# Patient Record
Sex: Male | Born: 1975 | Race: Black or African American | Hispanic: No | Marital: Single | Smoking: Current every day smoker
Health system: Southern US, Community
[De-identification: ages and names within clinical notes are randomized; demographics above are authoritative.]

---

## 2006-08-06 ENCOUNTER — Emergency Department (HOSPITAL_COMMUNITY): Admission: EM | Admit: 2006-08-06 | Discharge: 2006-08-07 | Payer: Self-pay | Admitting: Emergency Medicine

## 2006-08-19 ENCOUNTER — Ambulatory Visit: Payer: Self-pay | Admitting: Nurse Practitioner

## 2006-08-24 ENCOUNTER — Ambulatory Visit: Payer: Self-pay | Admitting: *Deleted

## 2007-07-14 ENCOUNTER — Encounter (INDEPENDENT_AMBULATORY_CARE_PROVIDER_SITE_OTHER): Payer: Self-pay | Admitting: *Deleted

## 2009-05-24 ENCOUNTER — Emergency Department (HOSPITAL_COMMUNITY): Admission: EM | Admit: 2009-05-24 | Discharge: 2009-05-24 | Payer: Self-pay | Admitting: Emergency Medicine

## 2014-10-20 ENCOUNTER — Emergency Department (HOSPITAL_COMMUNITY)
Admission: EM | Admit: 2014-10-20 | Discharge: 2014-10-20 | Disposition: A | Discharge status: Home / Self Care | Payer: Self-pay | Attending: Emergency Medicine | Admitting: Emergency Medicine

## 2014-10-20 ENCOUNTER — Emergency Department (HOSPITAL_COMMUNITY): Payer: Self-pay

## 2014-10-20 ENCOUNTER — Encounter (HOSPITAL_COMMUNITY): Payer: Self-pay | Admitting: Nurse Practitioner

## 2014-10-20 DIAGNOSIS — Z720 Tobacco use: Secondary | ICD-10-CM | POA: Insufficient documentation

## 2014-10-20 DIAGNOSIS — S0083XA Contusion of other part of head, initial encounter: Secondary | ICD-10-CM

## 2014-10-20 DIAGNOSIS — S01412A Laceration without foreign body of left cheek and temporomandibular area, initial encounter: Secondary | ICD-10-CM | POA: Insufficient documentation

## 2014-10-20 DIAGNOSIS — Y998 Other external cause status: Secondary | ICD-10-CM | POA: Insufficient documentation

## 2014-10-20 DIAGNOSIS — H1131 Conjunctival hemorrhage, right eye: Secondary | ICD-10-CM | POA: Insufficient documentation

## 2014-10-20 DIAGNOSIS — Y9389 Activity, other specified: Secondary | ICD-10-CM | POA: Insufficient documentation

## 2014-10-20 DIAGNOSIS — S0011XA Contusion of right eyelid and periocular area, initial encounter: Secondary | ICD-10-CM

## 2014-10-20 DIAGNOSIS — Y9289 Other specified places as the place of occurrence of the external cause: Secondary | ICD-10-CM | POA: Insufficient documentation

## 2014-10-20 DIAGNOSIS — S02402A Zygomatic fracture, unspecified, initial encounter for closed fracture: Secondary | ICD-10-CM | POA: Insufficient documentation

## 2014-10-20 DIAGNOSIS — S022XXA Fracture of nasal bones, initial encounter for closed fracture: Secondary | ICD-10-CM | POA: Insufficient documentation

## 2014-10-20 MED ORDER — LIDOCAINE-EPINEPHRINE-TETRACAINE (LET) SOLUTION
3.0000 mL | Freq: Once | NASAL | Status: AC
  Administered 2014-10-20: 3 mL via TOPICAL
  Filled 2014-10-20: qty 3

## 2014-10-20 MED ORDER — HYDROCODONE-ACETAMINOPHEN 5-325 MG PO TABS: 1.0000 | ORAL_TABLET | Freq: Four times a day (QID) | ORAL | Status: DC | PRN

## 2014-10-20 NOTE — ED Notes (Signed)
Dr. Lynelle DoctorKnapp at bedside to inform patient and family member of radiology results and plan of care
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
## 2014-10-20 NOTE — Discharge Instructions (Signed)
Ice packs to the swollen, bruised and painful areas. Take the medication as prescribed. Call Dr Ellyn HackGore's office to get an appointment to be rechecked next week for your broken nose and right cheek bone. You have a bruise on your right eyeball which is the red area on the white of your eye. Call Dr Anne HahnGeiger's office to get an appointment to have him recheck your eye next week. Return to the ED if you have any problems listed on the head injury sheet. Keep the glue on your laceration dry and do not use soap on the glue or it will dissolve too quickly. When the glue dissolves your cut should be healed.  Zygoma Fracture A fracture (break) in your cheekbone is also called a zygoma fracture. If this bone is in normal position, conservative treatment may be all that is necessary. This means an operation is not required. If this bone is displaced, surgery may be necessary to repair the fracture and get it back into position. This fracture is easily diagnosed with x-rays. LET YOUR CAREGIVER KNOW ABOUT:  Allergies.  Medications taken including herbs, eye drops, over the counter medications, and creams.  Use of steroids by mouth or creams.  Other health problems.  Possibility of pregnancy, if this applies.  History of blood clots (thrombophlebitis).  History of bleeding or blood problems.  Previous surgery.  Previous problems with anesthetics or novocaine. HOME CARE INSTRUCTIONS   You may resume normal diet and activities as directed or allowed.  Take prescribed medication as directed. Only take over-the-counter or prescription medicines for pain, discomfort, or fever as directed by your caregiver. Do not give aspirin to children less than 36 years of age unless advised by your caregiver because of the association with Reye's Syndrome.  Apply ice to the areas of pain and swelling for 15-20 minutes every hour while awake, for 2 days. Put the ice in a plastic bag and place a thin towel between the bag of  ice and your cast, splint, or wrap. SEEK MEDICAL CARE IF:   Increased pain, swelling, or bruising of the cheek that is not relieved with medication.  Increasing warmth or redness (inflammation) in the area of injury.  Problems with increasing swelling or bruising of the injured area.  You develop any visual problems.  You develop any leak or discharge of watery material from your nose. Document Released: 07/08/2001 Document Revised: 01/05/2012 Document Reviewed: 02/15/2008 [facility] Patient Information 2015 [place], [place]. This information is not intended to replace advice given to you by your health care provider. Make sure you discuss any questions you have with your health care provider.  Subconjunctival Hemorrhage Your exam shows you have a subconjunctival hemorrhage. This is a harmless collection of blood covering a portion of the white of the eye. This condition may be due to injury or to straining (lifting, sneezing, or coughing). Often, there is no known cause. Subconjunctival blood does not cause pain or vision problems. This condition needs no treatment. It will take 1 to 2 weeks for the blood to dissolve. If you take aspirin or Coumadin on a daily basis or if you have high blood pressure, you should check with your doctor about the need for further treatment. Please call your doctor if you have problems with your vision, pain around the eye, or any other concerns about your condition. Document Released: 11/20/2004 Document Revised: 01/05/2012 Document Reviewed: 09/10/2009 [facility] Patient Information 2015 [place], [place]. This information is not intended to replace advice given to  you by your health care provider. Make sure you discuss any questions you have with your health care provider.  Nasal Fracture A fracture is a break in the bone. A nasal fracture is a broken nose. Minor breaks do not need treatment. Serious breaks may need surgery.  HOME CARE  Put ice on the injured  area.  Put ice in a plastic bag.  Place a towel between your skin and the bag.  Leave the ice on for 15-20 minutes, 03-04 times a day.  Only take medicine as told by your doctor.  If your nose bleeds, squeeze your nose shut gently. Sit upright for 10 minutes.  Do not play contact sports for 3 to 4 weeks or as told by your doctor. GET HELP RIGHT AWAY IF:   You have more pain or severe pain.  You keep having nosebleeds.  The shape of your nose does not return to normal after 5 days.  You have yellowish white fluid (pus) coming from your nose.  Your nose bleeds for over 20 minutes.  Clear fluid drains from your nose.  You have a grape-like puffiness (swelling) on the inside of your nose.  You have trouble moving your eyes.  You keep throwing up (vomiting). MAKE SURE YOU:   Understand these instructions.  Will watch this condition.  Will get help right away if you are not doing well or get worse. Document Released: 07/22/2008 Document Revised: 01/05/2012 Document Reviewed: 01/27/2011 [facility] Patient Information 2015 [place], [place]. This information is not intended to replace advice given to you by your health care provider. Make sure you discuss any questions you have with your health care provider.  Head Injury You have a head injury. Headaches and throwing up (vomiting) are common after a head injury. It should be easy to wake up from sleeping. Sometimes you must stay in the hospital. Most problems happen within the first 24 hours. Side effects may occur up to 7-10 days after the injury.  WHAT ARE THE TYPES OF HEAD INJURIES? Head injuries can be as minor as a bump. Some head injuries can be more severe. More severe head injuries include:  A jarring injury to the brain (concussion).  A bruise of the brain (contusion). This mean there is bleeding in the brain that can cause swelling.  A cracked skull (skull fracture).  Bleeding in the brain that collects, clots, and  forms a bump (hematoma). WHEN SHOULD I GET HELP RIGHT AWAY?   You are confused or sleepy.  You cannot be woken up.  You feel sick to your stomach (nauseous) or keep throwing up (vomiting).  Your dizziness or unsteadiness is getting worse.  You have very bad, lasting headaches that are not helped by medicine. Take medicines only as told by your doctor.  You cannot use your arms or legs like normal.  You cannot walk.  You notice changes in the black spots in the center of the colored part of your eye (pupil).  You have clear or bloody fluid coming from your nose or ears.  You have trouble seeing. During the next 24 hours after the injury, you must stay with someone who can watch you. This person should get help right away (call 911 in the [place] if you start to shake and are not able to control it (have seizures), you pass out, or you are unable to wake up. HOW CAN I PREVENT A HEAD INJURY IN THE FUTURE?  Wear seat belts.  Wear a helmet  while bike riding and playing sports like football.  Stay away from dangerous activities around the house. WHEN CAN I RETURN TO NORMAL ACTIVITIES AND ATHLETICS? See your doctor before doing these activities. You should not do normal activities or play contact sports until 1 week after the following symptoms have stopped:  Headache that does not go away.  Dizziness.  Poor attention.  Confusion.  Memory problems.  Sickness to your stomach or throwing up.  Tiredness.  Fussiness.  Bothered by bright lights or loud noises.  Anxiousness or depression.  Restless sleep. MAKE SURE YOU:   Understand these instructions.  Will watch your condition.  Will get help right away if you are not doing well or get worse. Document Released: 09/25/2008 Document Revised: 02/27/2014 Document Reviewed: 06/20/2013 [facility] Patient Information 2015 [place], [place]. This information is not intended to replace advice given to you by your health care  provider. Make sure you discuss any questions you have with your health care provider.  Cryotherapy Cryotherapy is when you put ice on your injury. Ice helps lessen pain and puffiness (swelling) after an injury. Ice works the best when you start using it in the first 24 to 48 hours after an injury. HOME CARE  Put a dry or damp towel between the ice pack and your skin.  You may press gently on the ice pack.  Leave the ice on for no more than 10 to 20 minutes at a time.  Check your skin after 5 minutes to make sure your skin is okay.  Rest at least 20 minutes between ice pack uses.  Stop using ice when your skin loses feeling (numbness).  Do not use ice on someone who cannot tell you when it hurts. This includes small children and people with memory problems (dementia). GET HELP RIGHT AWAY IF:  You have white spots on your skin.  Your skin turns blue or pale.  Your skin feels waxy or hard.  Your puffiness gets worse. MAKE SURE YOU:   Understand these instructions.  Will watch your condition.  Will get help right away if you are not doing well or get worse. Document Released: 03/31/2008 Document Revised: 01/05/2012 Document Reviewed: 06/05/2011 [facility] Patient Information 2015 [place], [place]. This information is not intended to replace advice given to you by your health care provider. Make sure you discuss any questions you have with your health care provider.

## 2014-10-20 NOTE — ED Notes (Signed)
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
## 2014-10-20 NOTE — ED Notes (Signed)
PA at bedside.
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
## 2015-08-08 ENCOUNTER — Encounter (HOSPITAL_COMMUNITY): Payer: Self-pay | Admitting: Emergency Medicine

## 2015-08-08 ENCOUNTER — Emergency Department (HOSPITAL_COMMUNITY)
Admission: EM | Admit: 2015-08-08 | Discharge: 2015-08-08 | Disposition: A | Discharge status: Home / Self Care | Payer: Self-pay | Attending: Emergency Medicine | Admitting: Emergency Medicine

## 2015-08-08 DIAGNOSIS — K4090 Unilateral inguinal hernia, without obstruction or gangrene, not specified as recurrent: Secondary | ICD-10-CM | POA: Insufficient documentation

## 2015-08-08 DIAGNOSIS — Z720 Tobacco use: Secondary | ICD-10-CM | POA: Insufficient documentation

## 2015-08-08 LAB — URINALYSIS, ROUTINE W REFLEX MICROSCOPIC
BILIRUBIN URINE: NEGATIVE
Glucose, UA: NEGATIVE mg/dL
KETONES UR: NEGATIVE mg/dL
Leukocytes, UA: NEGATIVE
Nitrite: NEGATIVE
PROTEIN: NEGATIVE mg/dL
Specific Gravity, Urine: 1.025 (ref 1.005–1.030)
Urobilinogen, UA: 0.2 mg/dL (ref 0.0–1.0)
pH: 6 (ref 5.0–8.0)

## 2015-08-08 LAB — CBC WITH DIFFERENTIAL/PLATELET
BASOS ABS: 0 10*3/uL (ref 0.0–0.1)
BASOS PCT: 0 %
EOS PCT: 1 %
Eosinophils Absolute: 0.1 10*3/uL (ref 0.0–0.7)
HCT: 45.6 % (ref 39.0–52.0)
Hemoglobin: 15.4 g/dL (ref 13.0–17.0)
Lymphocytes Relative: 41 %
Lymphs Abs: 3.4 10*3/uL (ref 0.7–4.0)
MCH: 32.2 pg (ref 26.0–34.0)
MCHC: 33.8 g/dL (ref 30.0–36.0)
MCV: 95.4 fL (ref 78.0–100.0)
Monocytes Absolute: 0.6 10*3/uL (ref 0.1–1.0)
Monocytes Relative: 8 %
Neutro Abs: 4.1 10*3/uL (ref 1.7–7.7)
Neutrophils Relative %: 50 %
Platelets: 179 10*3/uL (ref 150–400)
RBC: 4.78 MIL/uL (ref 4.22–5.81)
RDW: 13.3 % (ref 11.5–15.5)
WBC: 8.1 10*3/uL (ref 4.0–10.5)

## 2015-08-08 LAB — URINE MICROSCOPIC-ADD ON

## 2015-08-08 LAB — COMPREHENSIVE METABOLIC PANEL
ALT: 12 U/L — AB (ref 17–63)
AST: 19 U/L (ref 15–41)
Albumin: 3.8 g/dL (ref 3.5–5.0)
Alkaline Phosphatase: 42 U/L (ref 38–126)
Anion gap: 11 (ref 5–15)
BUN: 11 mg/dL (ref 6–20)
CHLORIDE: 101 mmol/L (ref 101–111)
CO2: 28 mmol/L (ref 22–32)
CREATININE: 1.06 mg/dL (ref 0.61–1.24)
Calcium: 9.4 mg/dL (ref 8.9–10.3)
GFR calc non Af Amer: >60 mL/min (ref >60)
Glucose, Bld: 88 mg/dL (ref 65–99)
POTASSIUM: 3.9 mmol/L (ref 3.5–5.1)
Sodium: 140 mmol/L (ref 135–145)
Total Bilirubin: 0.3 mg/dL (ref 0.3–1.2)
Total Protein: 7.3 g/dL (ref 6.5–8.1)

## 2015-08-08 LAB — LIPASE, BLOOD: Lipase: 32 U/L (ref 22–51)

## 2015-08-08 NOTE — ED Notes (Signed)
Patient reports abdominal pain when palpated x 3 months.  Patient states it feels like a bruise on his lower abdomen, but doesn't actually hurt, just feels like a "mass." A&O x 4. Denies N/V.

## 2015-08-08 NOTE — ED Provider Notes (Signed)
TIME SEEN: 4:30 AM  CHIEF COMPLAINT: Lump in the lower abdomen  HPI: Pt is a 39 y.o. male who presents emergency department with a lump in his left inguinal region for the past several weeks. No pain here. States that this lump will come out more when he is straining and he has to push it back in. No nausea, vomiting or diarrhea. No fever. No history of abdominal surgeries.  ROS: See HPI Constitutional: no fever  Eyes: no drainage  ENT: no runny nose   Cardiovascular:  no chest pain  Resp: no SOB  GI: no vomiting GU: no dysuria Integumentary: no rash  Allergy: no hives  Musculoskeletal: no leg swelling  Neurological: no slurred speech ROS otherwise negative  PAST MEDICAL HISTORY/PAST SURGICAL HISTORY:  History reviewed. No pertinent past medical history.  MEDICATIONS:  Prior to Admission medications   Medication Sig Start Date End Date Taking? Authorizing Provider  HYDROcodone-acetaminophen (NORCO/VICODIN) 5-325 MG per tablet Take 1 tablet by mouth every 6 (six) hours as needed for moderate pain. 10/20/14   Devoria AlbeIva Knapp, MD    ALLERGIES:  No Known Allergies  SOCIAL HISTORY:  Social History  Substance Use Topics  . Smoking status: Current Every Day Smoker -- 0.50 packs/day    Types: Cigarettes  . Smokeless tobacco: Current User  . Alcohol Use: Yes    FAMILY HISTORY: History reviewed. No pertinent family history.  EXAM: Please see downtime report for vitals. CONSTITUTIONAL: Alert and oriented and responds appropriately to questions. Well-appearing; well-nourished HEAD: Normocephalic EYES: Conjunctivae clear, PERRL ENT: normal nose; no rhinorrhea; moist mucous membranes; pharynx without lesions noted NECK: Supple, no meningismus, no LAD  CARD: RRR; S1 and S2 appreciated; no murmurs, no clicks, no rubs, no gallops RESP: Normal chest excursion without splinting or tachypnea; breath sounds clear and equal bilaterally; no wheezes, no rhonchi, no rales, no hypoxia or  respiratory distress, speaking full sentences ABD/GI: Normal bowel sounds; non-distended; soft, non-tender, no rebound, no guarding, no peritoneal signs, patient has a left inguinal hernia that is easily reducible and nontender to palpation without overlying erythema, warmth GU:  Normal external genitalia, no testicular pain, masses, scrotal swelling BACK:  The back appears normal and is non-tender to palpation, there is no CVA tenderness EXT: Normal ROM in all joints; non-tender to palpation; no edema; normal capillary refill; no cyanosis, no calf tenderness or swelling    SKIN: Normal color for age and race; warm NEURO: Moves all extremities equally, sensation to light touch intact diffusely, cranial nerves II through XII intact PSYCH: The patient's mood and manner are appropriate. Grooming and personal hygiene are appropriate.  MEDICAL DECISION MAKING: Patient here with left inguinal hernia. Easily reducible. Labs ordered in triage as well as urine unremarkable. We'll give him outpatient follow-up information with surgery to discuss elective procedures if he would like his hernia repaired. I do not think he needs discharged on pain medication as he is not complaining of any pain while he was concerned about this "lump". Discussed return precautions. He verbalizes understanding is comfortable with this plan.       Layla MawKristen N Dessire Grimes, DO 08/08/15 [phone]

## 2015-08-08 NOTE — ED Notes (Signed)
Reviewed discharge instructions with patient; patient verbalized understanding.

## 2015-08-08 NOTE — Discharge Instructions (Signed)

## 2016-07-07 ENCOUNTER — Encounter (HOSPITAL_COMMUNITY): Payer: Self-pay | Admitting: *Deleted

## 2016-07-07 ENCOUNTER — Emergency Department (HOSPITAL_COMMUNITY)
Admission: EM | Admit: 2016-07-07 | Discharge: 2016-07-07 | Disposition: A | Discharge status: Home / Self Care | Payer: Medicaid Other | Attending: Emergency Medicine | Admitting: Emergency Medicine

## 2016-07-07 DIAGNOSIS — J020 Streptococcal pharyngitis: Secondary | ICD-10-CM | POA: Insufficient documentation

## 2016-07-07 DIAGNOSIS — J029 Acute pharyngitis, unspecified: Secondary | ICD-10-CM | POA: Diagnosis present

## 2016-07-07 DIAGNOSIS — F17210 Nicotine dependence, cigarettes, uncomplicated: Secondary | ICD-10-CM | POA: Diagnosis not present

## 2016-07-07 LAB — RAPID STREP SCREEN (MED CTR MEBANE ONLY): Streptococcus, Group A Screen (Direct): POSITIVE — AB

## 2016-07-07 MED ORDER — NAPROXEN 500 MG PO TABS: 500.0000 mg | ORAL_TABLET | Freq: Two times a day (BID) | ORAL | 0 refills | Status: DC

## 2016-07-07 MED ORDER — BENZOCAINE-MENTHOL 6-10 MG MT LOZG: 1.0000 | LOZENGE | OROMUCOSAL | 0 refills | Status: DC | PRN

## 2016-07-07 MED ORDER — AMOXICILLIN 500 MG PO CAPS: 500.0000 mg | ORAL_CAPSULE | Freq: Three times a day (TID) | ORAL | 0 refills | Status: DC

## 2016-07-07 MED ORDER — IBUPROFEN 400 MG PO TABS
600.0000 mg | ORAL_TABLET | Freq: Once | ORAL | Status: AC
  Administered 2016-07-07: 600 mg via ORAL
  Filled 2016-07-07: qty 1

## 2016-07-07 NOTE — Discharge Instructions (Signed)
Take the medication for infection until it is finished.  Take the medication for pain as needed.

## 2016-07-07 NOTE — ED Provider Notes (Signed)
[facility]-EMERGENCY DEPT Provider Note   CSN: [id number] Arrival date & time: 07/07/16  1122     History   Chief Complaint Chief Complaint  Patient presents with  . Sore Throat    HPI Omar Glover is a 40 y.o. male who presets to the ED with a sore throat that started 3 days ago. He reports that his 76 month old has a URI. Patient has not taken anything for pain. He denies fever or cough.   The history is provided by the patient. No language interpreter was used.  Sore Throat  This is a new problem. The current episode started more than 2 days ago. The problem occurs constantly. The problem has not changed since onset.Pertinent negatives include no chest pain, no abdominal pain, no headaches and no shortness of breath. The symptoms are aggravated by eating and swallowing. Nothing relieves the symptoms. He has tried nothing for the symptoms.    History reviewed. No pertinent past medical history.  There are no active problems to display for this patient.   History reviewed. No pertinent surgical history.     Home Medications    Prior to Admission medications   Medication Sig Start Date End Date Taking? Authorizing Provider  amoxicillin (AMOXIL) 500 MG capsule Take 1 capsule (500 mg total) by mouth 3 (three) times daily. 07/07/16   Adalis Gatti Orlene Och, NP  HYDROcodone-acetaminophen (NORCO/VICODIN) 5-325 MG per tablet Take 1 tablet by mouth every 6 (six) hours as needed for moderate pain. 10/20/14   Devoria Albe, MD  naproxen (NAPROSYN) 500 MG tablet Take 1 tablet (500 mg total) by mouth 2 (two) times daily. 07/07/16   Jaira Canady Orlene Och, NP    Family History No family history on file.  Social History Social History  Substance Use Topics  . Smoking status: Current Every Day Smoker    Packs/day: 0.50    Types: Cigarettes  . Smokeless tobacco: Never Used  . Alcohol use Yes     Comment: occasional - 1 pint liquor per weekend     Allergies   Review of patient's allergies indicates no known  allergies.   Review of Systems Review of Systems  Constitutional: Negative for chills and fever.  HENT: Positive for sore throat. Negative for congestion, ear pain and trouble swallowing.   Eyes: Negative for pain, redness, itching and visual disturbance.  Respiratory: Negative for cough, shortness of breath and wheezing.   Cardiovascular: Negative for chest pain, palpitations and leg swelling.  Gastrointestinal: Negative for abdominal pain, nausea and vomiting.  Genitourinary: Negative for dysuria and urgency.  Musculoskeletal: Negative for back pain and myalgias.  Skin: Negative for rash.  Neurological: Negative for syncope and headaches.  Psychiatric/Behavioral: Negative for confusion. The patient is not nervous/anxious.      Physical Exam Updated Vital Signs BP 115/71 (BP Location: Left Arm)   Pulse (!) 58   Temp 98.2 F (36.8 C) (Oral)   Resp 16   Ht 6\' 1"  (1.854 m)   Wt 77.1 kg   SpO2 99%   BMI 22.43 kg/m   Physical Exam  Constitutional: He is oriented to person, place, and time. He appears well-developed and well-nourished. No distress.  HENT:  Head: Normocephalic and atraumatic.  Right Ear: Tympanic membrane normal.  Left Ear: Tympanic membrane normal.  Nose: Nose normal.  Mouth/Throat: Uvula is midline and mucous membranes are normal. Posterior oropharyngeal erythema present. No oropharyngeal exudate, posterior oropharyngeal edema or tonsillar abscesses.  Eyes: EOM are normal.  Neck: Neck  supple.  Cardiovascular: Normal rate and regular rhythm.   Pulmonary/Chest: Effort normal and breath sounds normal.  Abdominal: Soft. Bowel sounds are normal. There is no tenderness.  Musculoskeletal: Normal range of motion.  Lymphadenopathy:    He has cervical adenopathy.  Neurological: He is alert and oriented to person, place, and time. No cranial nerve deficit.  Skin: Skin is warm and dry.  Psychiatric: He has a normal mood and affect. His behavior is normal.  Nursing  note and vitals reviewed.    ED Treatments / Results  Labs (all labs ordered are listed, but only abnormal results are displayed) Labs Reviewed  RAPID STREP SCREEN (NOT AT [facility]) - Abnormal; Notable for the following:       Result Value   Streptococcus, Group A Screen (Direct) POSITIVE (*)    All other components within normal limits     Radiology No results found.  Procedures Procedures (including critical care time)  Medications Ordered in ED Medications  ibuprofen (ADVIL,MOTRIN) tablet 600 mg (600 mg Oral Given 07/07/16 1246)     Initial Impression / Assessment and Plan / ED Course  I have reviewed the triage vital signs and the nursing notes.  Pertinent labs & imaging results that were available during my care of the patient were reviewed by me and considered in my medical decision making (see chart for details).  Clinical Course  40 y.o. male with sore throat stable for d/c with positive strep screen, no difficulty swallowing and does not appear toxic. Will treat for infection. Return precautions given.   Final Clinical Impressions(s) / ED Diagnoses   Final diagnoses:  Strep throat    New Prescriptions New Prescriptions   AMOXICILLIN (AMOXIL) 500 MG CAPSULE    Take 1 capsule (500 mg total) by mouth 3 (three) times daily.   NAPROXEN (NAPROSYN) 500 MG TABLET    Take 1 tablet (500 mg total) by mouth 2 (two) times daily.     SalixHope M Antino Mayabb, NP 07/07/16 1253    Donnetta HutchingBrian Cook, MD 07/08/16 [phone]

## 2016-07-07 NOTE — ED Triage Notes (Signed)
PT states sore throat and swollen glands 2 days ago.  Throat red and swollen.  Pt still able to swallow.

## 2016-10-08 ENCOUNTER — Emergency Department (HOSPITAL_COMMUNITY): Payer: Medicaid Other

## 2016-10-08 ENCOUNTER — Encounter (HOSPITAL_COMMUNITY): Payer: Self-pay | Admitting: *Deleted

## 2016-10-08 ENCOUNTER — Emergency Department (HOSPITAL_COMMUNITY)
Admission: EM | Admit: 2016-10-08 | Discharge: 2016-10-08 | Disposition: A | Discharge status: Home / Self Care | Payer: Medicaid Other | Attending: Emergency Medicine | Admitting: Emergency Medicine

## 2016-10-08 DIAGNOSIS — W260XXA Contact with knife, initial encounter: Secondary | ICD-10-CM | POA: Diagnosis not present

## 2016-10-08 DIAGNOSIS — F17210 Nicotine dependence, cigarettes, uncomplicated: Secondary | ICD-10-CM | POA: Insufficient documentation

## 2016-10-08 DIAGNOSIS — S61311A Laceration without foreign body of left index finger with damage to nail, initial encounter: Secondary | ICD-10-CM | POA: Diagnosis not present

## 2016-10-08 DIAGNOSIS — Y939 Activity, unspecified: Secondary | ICD-10-CM | POA: Insufficient documentation

## 2016-10-08 DIAGNOSIS — Y929 Unspecified place or not applicable: Secondary | ICD-10-CM | POA: Insufficient documentation

## 2016-10-08 DIAGNOSIS — Y999 Unspecified external cause status: Secondary | ICD-10-CM | POA: Diagnosis not present

## 2016-10-08 DIAGNOSIS — S6992XA Unspecified injury of left wrist, hand and finger(s), initial encounter: Secondary | ICD-10-CM | POA: Diagnosis present

## 2016-10-08 DIAGNOSIS — T1490XA Injury, unspecified, initial encounter: Secondary | ICD-10-CM

## 2016-10-08 DIAGNOSIS — Z23 Encounter for immunization: Secondary | ICD-10-CM | POA: Diagnosis not present

## 2016-10-08 LAB — CBC WITH DIFFERENTIAL/PLATELET
BASOS ABS: 0 10*3/uL (ref 0.0–0.1)
Basophils Relative: 0 %
EOS PCT: 1 %
Eosinophils Absolute: 0 10*3/uL (ref 0.0–0.7)
HCT: 44.7 % (ref 39.0–52.0)
HEMOGLOBIN: 15.5 g/dL (ref 13.0–17.0)
Lymphocytes Relative: 37 %
Lymphs Abs: 2.3 10*3/uL (ref 0.7–4.0)
MCH: 32 pg (ref 26.0–34.0)
MCHC: 34.7 g/dL (ref 30.0–36.0)
MCV: 92.4 fL (ref 78.0–100.0)
Monocytes Absolute: 0.5 10*3/uL (ref 0.1–1.0)
Monocytes Relative: 8 %
Neutro Abs: 3.4 10*3/uL (ref 1.7–7.7)
Neutrophils Relative %: 54 %
PLATELETS: 197 10*3/uL (ref 150–400)
RBC: 4.84 MIL/uL (ref 4.22–5.81)
RDW: 13.2 % (ref 11.5–15.5)
WBC: 6.2 10*3/uL (ref 4.0–10.5)

## 2016-10-08 LAB — BASIC METABOLIC PANEL
ANION GAP: 10 (ref 5–15)
BUN: 16 mg/dL (ref 6–20)
CO2: 25 mmol/L (ref 22–32)
Calcium: 9.9 mg/dL (ref 8.9–10.3)
Chloride: 104 mmol/L (ref 101–111)
Creatinine, Ser: 1.08 mg/dL (ref 0.61–1.24)
GFR calc Af Amer: >60 mL/min (ref >60)
GLUCOSE: 120 mg/dL — AB (ref 65–99)
POTASSIUM: 3.9 mmol/L (ref 3.5–5.1)
SODIUM: 139 mmol/L (ref 135–145)

## 2016-10-08 MED ORDER — CEFAZOLIN IN D5W 1 GM/50ML IV SOLN
1.0000 g | Freq: Once | INTRAVENOUS | Status: AC
  Administered 2016-10-08: 1 g via INTRAVENOUS
  Filled 2016-10-08: qty 50

## 2016-10-08 MED ORDER — TETANUS-DIPHTH-ACELL PERTUSSIS 5-2.5-18.5 LF-MCG/0.5 IM SUSP
0.5000 mL | Freq: Once | INTRAMUSCULAR | Status: AC
  Administered 2016-10-08: 0.5 mL via INTRAMUSCULAR
  Filled 2016-10-08: qty 0.5

## 2016-10-08 MED ORDER — HYDROMORPHONE HCL 2 MG/ML IJ SOLN
1.0000 mg | Freq: Once | INTRAMUSCULAR | Status: AC
  Administered 2016-10-08: 1 mg via INTRAVENOUS
  Filled 2016-10-08: qty 1

## 2016-10-08 MED ORDER — HYDROCODONE-ACETAMINOPHEN 5-325 MG PO TABS: 1.0000 | ORAL_TABLET | Freq: Four times a day (QID) | ORAL | 0 refills | Status: DC | PRN

## 2016-10-08 MED ORDER — LIDOCAINE HCL (PF) 1 % IJ SOLN
30.0000 mL | Freq: Once | INTRAMUSCULAR | Status: AC
  Administered 2016-10-08: 30 mL via INTRADERMAL
  Filled 2016-10-08: qty 30

## 2016-10-08 MED ORDER — CEPHALEXIN 500 MG PO CAPS: 500.0000 mg | ORAL_CAPSULE | Freq: Four times a day (QID) | ORAL | 0 refills | Status: AC

## 2016-10-08 MED ORDER — NAPROXEN 500 MG PO TABS: 500.0000 mg | ORAL_TABLET | Freq: Two times a day (BID) | ORAL | 0 refills | Status: DC

## 2016-10-08 NOTE — ED Notes (Addendum)
Ortho paged for static L index finger splint.

## 2016-10-08 NOTE — ED Notes (Signed)
Ortho tech notified this RN that patient is bleeding through dressing.  Dressing is soaked through.  Pt sts it never stopped bleeding once stitches were placed.  MD notified.

## 2016-10-08 NOTE — ED Notes (Signed)
EDP at bedside  

## 2016-10-08 NOTE — ED Notes (Signed)
Patient taken to xray.
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
## 2016-10-08 NOTE — ED Notes (Signed)
Given drink and food per MD okay

## 2016-10-08 NOTE — Progress Notes (Signed)
Orthopedic Tech Progress Note Patient Details:  Omar Glover 08/16/1976 [id number] Applied dressing to hand Patient ID: Omar Glover, male   DOB: 03/22/1976, 40 y.o.   MRN: [id number]   Omar Glover, Omar Glover 10/08/2016, 8:02 PM

## 2016-10-08 NOTE — ED Notes (Signed)
Ortho on way.
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
## 2016-10-09 ENCOUNTER — Other Ambulatory Visit: Payer: Self-pay | Admitting: Orthopedic Surgery

## 2016-10-09 NOTE — ED Notes (Signed)
Wasted 1 mg dilaudid with Anell BarrKaren Cobb RN.

## 2016-10-10 ENCOUNTER — Ambulatory Visit (HOSPITAL_BASED_OUTPATIENT_CLINIC_OR_DEPARTMENT_OTHER): Payer: Medicaid Other | Admitting: Certified Registered"

## 2016-10-10 ENCOUNTER — Encounter (HOSPITAL_BASED_OUTPATIENT_CLINIC_OR_DEPARTMENT_OTHER)
Admission: RE | Disposition: A | Discharge status: Home / Self Care | Payer: Self-pay | Source: Ambulatory Visit | Attending: Orthopedic Surgery

## 2016-10-10 ENCOUNTER — Ambulatory Visit (HOSPITAL_BASED_OUTPATIENT_CLINIC_OR_DEPARTMENT_OTHER)
Admission: RE | Admit: 2016-10-10 | Discharge: 2016-10-10 | Disposition: A | Discharge status: Home / Self Care | Payer: Medicaid Other | Source: Ambulatory Visit | Attending: Orthopedic Surgery | Admitting: Orthopedic Surgery

## 2016-10-10 ENCOUNTER — Encounter (HOSPITAL_BASED_OUTPATIENT_CLINIC_OR_DEPARTMENT_OTHER): Payer: Self-pay | Admitting: *Deleted

## 2016-10-10 DIAGNOSIS — S62391B Other fracture of second metacarpal bone, left hand, initial encounter for open fracture: Secondary | ICD-10-CM | POA: Insufficient documentation

## 2016-10-10 DIAGNOSIS — S65112A Laceration of radial artery at wrist and hand level of left arm, initial encounter: Secondary | ICD-10-CM | POA: Insufficient documentation

## 2016-10-10 DIAGNOSIS — F17210 Nicotine dependence, cigarettes, uncomplicated: Secondary | ICD-10-CM | POA: Insufficient documentation

## 2016-10-10 DIAGNOSIS — S6402XA Injury of ulnar nerve at wrist and hand level of left arm, initial encounter: Secondary | ICD-10-CM | POA: Diagnosis not present

## 2016-10-10 DIAGNOSIS — Y9389 Activity, other specified: Secondary | ICD-10-CM | POA: Diagnosis not present

## 2016-10-10 DIAGNOSIS — Y9289 Other specified places as the place of occurrence of the external cause: Secondary | ICD-10-CM | POA: Insufficient documentation

## 2016-10-10 DIAGNOSIS — Y998 Other external cause status: Secondary | ICD-10-CM | POA: Insufficient documentation

## 2016-10-10 DIAGNOSIS — S6422XA Injury of radial nerve at wrist and hand level of left arm, initial encounter: Secondary | ICD-10-CM | POA: Diagnosis not present

## 2016-10-10 DIAGNOSIS — S61412A Laceration without foreign body of left hand, initial encounter: Secondary | ICD-10-CM | POA: Diagnosis present

## 2016-10-10 HISTORY — PX: NERVE, TENDON AND ARTERY REPAIR: SHX5695

## 2016-10-10 HISTORY — PX: OPEN REDUCTION INTERNAL FIXATION (ORIF) METACARPAL: SHX6234

## 2016-10-10 SURGERY — NERVE, TENDON AND ARTERY REPAIR
Anesthesia: General | Site: Hand | Laterality: Left

## 2016-10-10 MED ORDER — SCOPOLAMINE 1 MG/3DAYS TD PT72: 1.0000 | MEDICATED_PATCH | Freq: Once | TRANSDERMAL | Status: DC | PRN

## 2016-10-10 MED ORDER — LACTATED RINGERS IV SOLN: 500.0000 mL | INTRAVENOUS | Status: DC

## 2016-10-10 MED ORDER — FENTANYL CITRATE (PF) 100 MCG/2ML IJ SOLN
50.0000 ug | INTRAMUSCULAR | Status: AC | PRN
  Administered 2016-10-10 (×4): 50 ug via INTRAVENOUS

## 2016-10-10 MED ORDER — GLYCOPYRROLATE 0.2 MG/ML IJ SOLN
0.2000 mg | Freq: Once | INTRAMUSCULAR | Status: DC | PRN
Start: 2016-10-10 — End: 2016-10-10

## 2016-10-10 MED ORDER — MIDAZOLAM HCL 2 MG/2ML IJ SOLN
INTRAMUSCULAR | Status: AC
  Filled 2016-10-10: qty 2

## 2016-10-10 MED ORDER — LACTATED RINGERS IV SOLN
INTRAVENOUS | Status: DC
  Administered 2016-10-10 (×2): via INTRAVENOUS

## 2016-10-10 MED ORDER — FENTANYL CITRATE (PF) 100 MCG/2ML IJ SOLN
INTRAMUSCULAR | Status: AC
  Filled 2016-10-10: qty 2

## 2016-10-10 MED ORDER — LACTATED RINGERS IV SOLN: INTRAVENOUS | Status: DC

## 2016-10-10 MED ORDER — PROPOFOL 10 MG/ML IV BOLUS
INTRAVENOUS | Status: DC | PRN
  Administered 2016-10-10: 150 mg via INTRAVENOUS

## 2016-10-10 MED ORDER — CEFAZOLIN SODIUM-DEXTROSE 2-4 GM/100ML-% IV SOLN
INTRAVENOUS | Status: AC
  Filled 2016-10-10: qty 100

## 2016-10-10 MED ORDER — BUPIVACAINE-EPINEPHRINE (PF) 0.25% -1:200000 IJ SOLN
INTRAMUSCULAR | Status: DC | PRN
  Administered 2016-10-10: 10 mL via PERINEURAL

## 2016-10-10 MED ORDER — MIDAZOLAM HCL 2 MG/2ML IJ SOLN
1.0000 mg | INTRAMUSCULAR | Status: DC | PRN
  Administered 2016-10-10: 2 mg via INTRAVENOUS

## 2016-10-10 MED ORDER — OXYCODONE HCL 5 MG/5ML PO SOLN: 5.0000 mg | Freq: Once | ORAL | Status: DC | PRN

## 2016-10-10 MED ORDER — OXYCODONE HCL 5 MG PO TABS: 5.0000 mg | ORAL_TABLET | Freq: Four times a day (QID) | ORAL | 0 refills | Status: DC | PRN

## 2016-10-10 MED ORDER — OXYCODONE HCL 5 MG PO TABS: 5.0000 mg | ORAL_TABLET | Freq: Once | ORAL | Status: DC | PRN

## 2016-10-10 MED ORDER — CEFAZOLIN SODIUM-DEXTROSE 2-4 GM/100ML-% IV SOLN
2.0000 g | INTRAVENOUS | Status: AC
  Administered 2016-10-10: 2 g via INTRAVENOUS

## 2016-10-10 MED ORDER — ONDANSETRON HCL 4 MG/2ML IJ SOLN
INTRAMUSCULAR | Status: DC | PRN
  Administered 2016-10-10: 4 mg via INTRAVENOUS

## 2016-10-10 MED ORDER — ONDANSETRON HCL 4 MG/2ML IJ SOLN: 4.0000 mg | Freq: Four times a day (QID) | INTRAMUSCULAR | Status: DC | PRN

## 2016-10-10 MED ORDER — LIDOCAINE 2% (20 MG/ML) 5 ML SYRINGE
INTRAMUSCULAR | Status: DC | PRN
  Administered 2016-10-10: 60 mg via INTRAVENOUS

## 2016-10-10 MED ORDER — HYDROMORPHONE HCL 1 MG/ML IJ SOLN: 0.2500 mg | INTRAMUSCULAR | Status: DC | PRN

## 2016-10-10 MED ORDER — DEXAMETHASONE SODIUM PHOSPHATE 10 MG/ML IJ SOLN
INTRAMUSCULAR | Status: DC | PRN
  Administered 2016-10-10: 10 mg via INTRAVENOUS

## 2016-10-10 SURGICAL SUPPLY — 72 items
BIT DRILL MICR ACTRK 2 LNG PRF (BIT) ×1 IMPLANT
BLADE MINI RND TIP GREEN BEAV (BLADE) IMPLANT
BLADE SURG 15 STRL LF DISP TIS (BLADE) ×1 IMPLANT
BLADE SURG 15 STRL SS (BLADE) ×2
BNDG COHESIVE 4X5 TAN STRL (GAUZE/BANDAGES/DRESSINGS) ×3 IMPLANT
BNDG ESMARK 4X9 LF (GAUZE/BANDAGES/DRESSINGS) ×3 IMPLANT
BNDG GAUZE ELAST 4 BULKY (GAUZE/BANDAGES/DRESSINGS) ×3 IMPLANT
BRUSH SCRUB EZ PLAIN DRY (MISCELLANEOUS) ×3 IMPLANT
CHLORAPREP W/TINT 26ML (MISCELLANEOUS) ×3 IMPLANT
CORDS BIPOLAR (ELECTRODE) ×3 IMPLANT
COVER BACK TABLE 60X90IN (DRAPES) ×3 IMPLANT
COVER MAYO STAND STRL (DRAPES) ×3 IMPLANT
CUFF TOURNIQUET SINGLE 18IN (TOURNIQUET CUFF) ×3 IMPLANT
DECANTER SPIKE VIAL GLASS SM (MISCELLANEOUS) IMPLANT
DRAPE EXTREMITY T 121X128X90 (DRAPE) ×3 IMPLANT
DRAPE OEC MINIVIEW 54X84 (DRAPES) ×3 IMPLANT
DRAPE SURG 17X23 STRL (DRAPES) ×3 IMPLANT
DRILL MICRO ACUTRAK 2 LNG PROF (BIT) ×3
DRSG EMULSION OIL 3X3 NADH (GAUZE/BANDAGES/DRESSINGS) ×3 IMPLANT
ELECT REM PT RETURN 9FT ADLT (ELECTROSURGICAL)
ELECTRODE REM PT RTRN 9FT ADLT (ELECTROSURGICAL) IMPLANT
GLOVE BIO SURGEON STRL SZ7.5 (GLOVE) ×3 IMPLANT
GLOVE BIOGEL PI IND STRL 7.0 (GLOVE) ×3 IMPLANT
GLOVE BIOGEL PI IND STRL 8 (GLOVE) ×1 IMPLANT
GLOVE BIOGEL PI INDICATOR 7.0 (GLOVE) ×6
GLOVE BIOGEL PI INDICATOR 8 (GLOVE) ×2
GLOVE ECLIPSE 6.5 STRL STRAW (GLOVE) ×6 IMPLANT
GOWN STRL REUS W/ TWL LRG LVL3 (GOWN DISPOSABLE) ×2 IMPLANT
GOWN STRL REUS W/TWL LRG LVL3 (GOWN DISPOSABLE) ×4
GOWN STRL REUS W/TWL XL LVL3 (GOWN DISPOSABLE) ×3 IMPLANT
GUIDEWIRE ORTHO MICROSHT  ACUT (WIRE) ×2
GUIDEWIRE ORTHO MICROSHT .035 (WIRE) ×1 IMPLANT
LOOP VESSEL MAXI BLUE (MISCELLANEOUS) IMPLANT
LOOP VESSEL MINI RED (MISCELLANEOUS) IMPLANT
NEEDLE HYPO 25X1 1.5 SAFETY (NEEDLE) IMPLANT
NS IRRIG 1000ML POUR BTL (IV SOLUTION) ×3 IMPLANT
PACK BASIN DAY SURGERY FS (CUSTOM PROCEDURE TRAY) ×3 IMPLANT
PADDING CAST ABS 4INX4YD NS (CAST SUPPLIES) ×2
PADDING CAST ABS COTTON 4X4 ST (CAST SUPPLIES) ×1 IMPLANT
PENCIL BUTTON HOLSTER BLD 10FT (ELECTRODE) IMPLANT
RUBBERBAND STERILE (MISCELLANEOUS) ×6 IMPLANT
SCREW ACUTRAK 2 MICRO 18MM (Screw) ×3 IMPLANT
SLEEVE SCD COMPRESS KNEE MED (MISCELLANEOUS) ×3 IMPLANT
SLING ARM FOAM STRAP LRG (SOFTGOODS) IMPLANT
SPEAR EYE SURG WECK-CEL (MISCELLANEOUS) IMPLANT
SPLINT PLASTER CAST XFAST 3X15 (CAST SUPPLIES) IMPLANT
SPLINT PLASTER XTRA FASTSET 3X (CAST SUPPLIES)
SPONGE GAUZE 4X4 12PLY STER LF (GAUZE/BANDAGES/DRESSINGS) ×3 IMPLANT
STOCKINETTE 6  STRL (DRAPES) ×2
STOCKINETTE 6 STRL (DRAPES) ×1 IMPLANT
SUT ETHIBOND 3-0 V-5 (SUTURE) IMPLANT
SUT ETHILON 8 0 BV130 4 (SUTURE) ×3 IMPLANT
SUT ETHILON 9 0 V 100.4 (SUTURE) IMPLANT
SUT FIBERWIRE 2-0 18 17.9 3/8 (SUTURE)
SUT PROLENE 6 0 P 1 18 (SUTURE) ×3 IMPLANT
SUT SILK 4 0 PS 2 (SUTURE) IMPLANT
SUT STEEL 4 (SUTURE) IMPLANT
SUT SUPRAMID 3-0 (SUTURE) ×12 IMPLANT
SUT SUPRAMID 4-0 (SUTURE) IMPLANT
SUT VIC AB 2-0 CT3 27 (SUTURE) ×3 IMPLANT
SUT VICRYL RAPIDE 4-0 (SUTURE) IMPLANT
SUT VICRYL RAPIDE 4/0 PS 2 (SUTURE) ×3 IMPLANT
SUTURE FIBERWR 2-0 18 17.9 3/8 (SUTURE) IMPLANT
SYR 10ML LL (SYRINGE) IMPLANT
SYR BULB 3OZ (MISCELLANEOUS) ×3 IMPLANT
TOWEL OR 17X24 6PK STRL BLUE (TOWEL DISPOSABLE) ×3 IMPLANT
TOWEL OR NON WOVEN STRL DISP B (DISPOSABLE) ×3 IMPLANT
TRAY DSU PREP LF (CUSTOM PROCEDURE TRAY) ×3 IMPLANT
TUBE CONNECTING 20'X1/4 (TUBING)
TUBE CONNECTING 20X1/4 (TUBING) IMPLANT
TUBE FEEDING 5FR 15 INCH (TUBING) IMPLANT
UNDERPAD 30X30 (UNDERPADS AND DIAPERS) ×3 IMPLANT

## 2016-10-10 NOTE — Transfer of Care (Signed)
Immediate Anesthesia Transfer of Care Note  Patient: Omar Glover  Procedure(s) Performed: Procedure(s): Left hand Nerve and Flexor Tendon Repair (Left) OPEN REDUCTION INTERNAL FIXATION (ORIF) LEFT SECOND METACARPAL (Left)  Patient Location: PACU  Anesthesia Type:General  Level of Consciousness: awake, alert , oriented and patient cooperative  Airway & Oxygen Therapy: Patient Spontanous Breathing and Patient connected to face mask oxygen  Post-op Assessment: Report given to RN and Post -op Vital signs reviewed and stable  Post vital signs: Reviewed and stable  Last Vitals:  Vitals:   10/10/16 1214  BP: 105/63  Pulse: (!) 46  Resp: 20  Temp: 36.4 C    Last Pain:  Vitals:   10/10/16 1214  TempSrc: Oral         Complications: No apparent anesthesia complications

## 2016-10-10 NOTE — Op Note (Signed)
312/15/2017  2:12 PM  PATIENT:  Omar Glover  40 y.o. male  PRE-OPERATIVE DIAGNOSIS:  Complex laceration to left hand  POST-OPERATIVE DIAGNOSIS:  Same, with osteochondral fracture  PROCEDURE:   1. Removal of sutures (from other provider) under anesthesia    2. Excisional debridement S/SQ/T/B associated with open fracture    3. ORIF left 2nd MC head osteochondral fracture    4. Repair of left IF MCP RCL and volar capsule    5. Repair of left IF FDP & FDS in Zone 3    6. Repair of left IF RDN and UDN by primary neurohhaphy     7. Intermediate repair of skin, left hand, 6cm    8. Radiographs--2-view left hand  SURGEON: Cliffton Astersavid A. Janee Mornhompson, MD  PHYSICIAN ASSISTANT: Danielle RankinKirsten Schrader, OPA-C  ANESTHESIA:  general  SPECIMENS:  None  DRAINS:   None  EBL:  less than 50 mL  PREOPERATIVE INDICATIONS:  Omar Glover is a  40 y.o. male with a complex deep laceration of the left hand at the level of the IF MCP  The risks benefits and alternatives were discussed with the patient preoperatively including but not limited to the risks of infection, bleeding, nerve injury, cardiopulmonary complications, the need for revision surgery, among others, and the patient verbalized understanding and consented to proceed.  OPERATIVE IMPLANTS: 18 mm micro-Acutrak screw  OPERATIVE PROCEDURE:  After receiving prophylactic antibiotics, the patient was escorted to the operative theatre and placed in a supine position.  General anesthesia was administered A surgical "time-out" was performed during which the planned procedure, proposed operative site, and the correct patient identity were compared to the operative consent and agreement confirmed by the circulating nurse according to current facility policy.  Following application of a tourniquet to the operative extremity, the exposed skin was pre-scrubbed with a Hibiclens scrub brush before being formally prepped with Chloraprep and draped in the usual sterile fashion.   The limb was exsanguinated with an Esmarch bandage and the tourniquet inflated to approximately 100mmHg higher than systolic BP.  First, sutures were removed from the wound.  The skin edges were then excisionally debrided with scissors to remove devitalized jagged edges.  Clot was removed from the wound and it was irrigated copiously.  The wound was then further explored and found that the radial and volar capsule of the MP joint had been divided.  There was chondral injury to the metacarpal head, and a small piece of osteochondral fragment from the volar ulnar portion was excised.  Again the wound is irrigated and there was another osteochondral flap noted that still had a hinge.  The hinge was deep to us, more dorsal and it open volarly according to the path of the blade that had caused the laceration.  A micro-Acutrak screw was then placed to help reduce and compress the fragment and images were obtained following reduction and fixation of the fracture.  2-0 Vicryl suture was then used to repair the radial collateral ligament and volar plate.  In order to gain better access to the neurovascular structures for exploration, the laceration was extended proximally at its ulnar margin so that the proximal flap to be retracted.  The digital neurovascular structures were explored and the ulnar artery was found to be intact.  The radial artery and nerve were severed as was the ulnar nerve.  Attention was shifted to repair the tendons, and both the FDP and FDS were transected at the level of the MP joint.  Proximally the  sheath was opened slightly to allow for access to the proximal tendon stumps.  The ends were retrieved, and debrided, as was some of the thickened hemorrhagic tenosynovium about the stumps.  Repairs were performed with a method-described by Nedra HaiLee, with 3-0 Supramid loop suture essentially performing a 6 strand repair with a 6-0 Prolene epitendinous running locked suture repair.  Both were repaired in  this fashion.  With wrist neutral and full digital extension, there was no gapping.  The A1 pulley was then incised to ensure no postoperative triggering would occur.  Attention was shifted to the nerves, where both digital nerves were trimmed back to healthy pouty fascicles, and standard epineurial repair performed with 3 simple 8-0 epineurial sutures under 4 power loupe magnification.  The tourniquet was released, the wound again copiously irrigated and local anesthetic place for postoperative pain control.  The  traumatic skin laceration was then closed with 4-0 Vicryl Rapide interrupted sutures.  The surgical incision extension of the wound was also closed with the same suture type.  A short arm splint dressing was applied with a dorsal plaster component, placing the MP joints in flexion.  He was awakened and taken to the recovery room in stable condition, breathing spontaneously.  DISPOSITION: He'll be discharged home today.  We will send a referral for hand therapy to [facility] today or tomorrow, with instructions that he call [facility] early next week to make an appointment for later in the week.  At that time, custom splint will be fabricated and flexor tendon rehabilitation for zone 3 initiated.  I will plan to evaluate him in the office in 10-15 days for wound check and splint check, etc.
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
## 2016-10-10 NOTE — Interval H&P Note (Signed)
History and Physical Interval Note:  10/10/2016 2:11 PM  Omar Glover  has presented today for surgery, with the diagnosis of LEFT HAND LACERATION  The various methods of treatment have been discussed with the patient and family. After consideration of risks, benefits and other options for treatment, the patient has consented to  Procedure(s): NERVE, TENDON AND ARTERY REPAIR (Left) as a surgical intervention .  The patient's history has been reviewed, patient examined, no change in status, stable for surgery.  I have reviewed the patient's chart and labs.  Questions were answered to the patient's satisfaction.     [name].

## 2016-10-10 NOTE — Anesthesia Preprocedure Evaluation (Signed)
Anesthesia Evaluation  Patient identified by MRN, date of birth, ID band Patient awake    Reviewed: Allergy & Precautions, H&P , NPO status , Patient's Chart, lab work & pertinent test results  Airway Mallampati: II   Neck ROM: full    Dental   Pulmonary Current Smoker,    breath sounds clear to auscultation       Cardiovascular negative cardio ROS   Rhythm:regular Rate:Normal     Neuro/Psych    GI/Hepatic   Endo/Other    Renal/GU      Musculoskeletal   Abdominal   Peds  Hematology   Anesthesia Other Findings   Reproductive/Obstetrics                             Anesthesia Physical Anesthesia Plan  ASA: II  Anesthesia Plan: General   Post-op Pain Management:    Induction: Intravenous  Airway Management Planned: LMA  Additional Equipment:   Intra-op Plan:   Post-operative Plan:   Informed Consent: I have reviewed the patients History and Physical, chart, labs and discussed the procedure including the risks, benefits and alternatives for the proposed anesthesia with the patient or authorized representative who has indicated his/her understanding and acceptance.     Plan Discussed with: CRNA, Anesthesiologist and Surgeon  Anesthesia Plan Comments:         Anesthesia Quick Evaluation  

## 2016-10-10 NOTE — Discharge Instructions (Signed)
Discharge Instructions   You have a dressing with a plaster splint incorporated in it. Move your fingers as much as possible, making a full fist and fully opening the fist. NO strong gripping or grasping. Elevate your hand above your elbow to reduce pain & swelling of the digits.  Ice over the operative site may be helpful to reduce pain & swelling.  DO NOT USE HEAT. Pain medicine has been prescribed for you.  Take ibuprofen over the counter 800 mg 3x per day. Also take Tylenol, over the counter as prescribed on the bottle. Take pain medicine for break through severe pain. Leave the dressing in place until you return to our office.  You may shower, but keep the bandage clean & dry.  You may drive a car when you are off of prescription pain medications and can safely control your vehicle with both hands. Call our office to schedule and appointment for 10-15 days from the date of surgery. YOU MUST CALL [facility] on MONDAY at [phone] and attend therapy next week prior to returning to us for a follow up visit.    Please call [phone] during normal business hours or [phone] after hours for any problems. Including the following:  - excessive redness of the incisions - drainage for more than 4 days - fever of more than 101.5 F  *Please note that pain medications will not be refilled after hours or on weekends.    Post Anesthesia Home Care Instructions  Activity: Get plenty of rest for the remainder of the day. A responsible adult should stay with you for 24 hours following the procedure.  For the next 24 hours, DO NOT: -Drive a car -Advertising copywriterperate machinery -Drink alcoholic beverages -Take any medication unless instructed by your physician -Make any legal decisions or sign important papers.  Meals: Start with liquid foods such as gelatin or soup. Progress to regular foods as tolerated. Avoid greasy, spicy, heavy foods. If nausea and/or vomiting occur, drink only clear liquids  until the nausea and/or vomiting subsides. Call your physician if vomiting continues.  Special Instructions/Symptoms: Your throat may feel dry or sore from the anesthesia or the breathing tube placed in your throat during surgery. If this causes discomfort, gargle with warm salt water. The discomfort should disappear within 24 hours.  If you had a scopolamine patch placed behind your ear for the management of post- operative nausea and/or vomiting:  1. The medication in the patch is effective for 72 hours, after which it should be removed.  Wrap patch in a tissue and discard in the trash. Wash hands thoroughly with soap and water. 2. You may remove the patch earlier than 72 hours if you experience unpleasant side effects which may include dry mouth, dizziness or visual disturbances. 3. Avoid touching the patch. Wash your hands with soap and water after contact with the patch.

## 2016-10-10 NOTE — Anesthesia Postprocedure Evaluation (Signed)
Anesthesia Post Note  Patient: Omar GrossShawn Poupard  Procedure(s) Performed: Procedure(s) (LRB): Left hand Nerve and Flexor Tendon Repair (Left) OPEN REDUCTION INTERNAL FIXATION (ORIF) LEFT SECOND METACARPAL (Left)  Patient location during evaluation: PACU Anesthesia Type: General Level of consciousness: awake and alert and patient cooperative Pain management: pain level controlled Vital Signs Assessment: post-procedure vital signs reviewed and stable Respiratory status: spontaneous breathing and respiratory function stable Cardiovascular status: stable Anesthetic complications: no    Last Vitals:  Vitals:   10/10/16 1600 10/10/16 1615  BP: (!) 145/79 134/85  Pulse: 68 76  Resp: 12 13  Temp:      Last Pain:  Vitals:   10/10/16 1615  TempSrc:   PainSc: 0-No pain                 Amalya Salmons S

## 2016-10-10 NOTE — Anesthesia Procedure Notes (Signed)
Procedure Name: LMA Insertion Date/Time: 10/10/2016 2:20 PM Performed by: Oryan Winterton D Pre-anesthesia Checklist: Patient identified, Emergency Drugs available, Suction available and Patient being monitored Patient Re-evaluated:Patient Re-evaluated prior to inductionOxygen Delivery Method: Circle system utilized Preoxygenation: Pre-oxygenation with 100% oxygen Intubation Type: IV induction Ventilation: Mask ventilation without difficulty LMA: LMA inserted LMA Size: 4.0 Number of attempts: 1 Airway Equipment and Method: Bite block Placement Confirmation: positive ETCO2 Tube secured with: Tape Dental Injury: Teeth and Oropharynx as per pre-operative assessment

## 2016-10-14 ENCOUNTER — Encounter (HOSPITAL_BASED_OUTPATIENT_CLINIC_OR_DEPARTMENT_OTHER): Payer: Self-pay | Admitting: Orthopedic Surgery

## 2016-10-16 ENCOUNTER — Ambulatory Visit: Payer: Medicaid Other | Attending: Orthopedic Surgery | Admitting: Occupational Therapy

## 2016-10-16 DIAGNOSIS — M6281 Muscle weakness (generalized): Secondary | ICD-10-CM | POA: Insufficient documentation

## 2016-10-16 DIAGNOSIS — R600 Localized edema: Secondary | ICD-10-CM | POA: Diagnosis present

## 2016-10-16 DIAGNOSIS — M25542 Pain in joints of left hand: Secondary | ICD-10-CM | POA: Insufficient documentation

## 2016-10-16 DIAGNOSIS — M25642 Stiffness of left hand, not elsewhere classified: Secondary | ICD-10-CM

## 2016-10-16 NOTE — Patient Instructions (Signed)
WEARING SCHEDULE:  Wear splint at ALL times except for hygiene care (May remove finger strap only for exercises and then immediately place strap back on as directed by the therapist) - refer to other handout  PURPOSE:  To prevent movement and for protection until injury can heal  CARE OF SPLINT:  Keep splint away from heat sources including: stove, radiator or furnace, or a car in sunlight. The splint can melt and will no longer fit you properly  Keep away from pets and children  Clean the splint with rubbing alcohol 1-2 times per day.  * During this time, make sure you also clean your hand/arm as instructed by your therapist and/or perform dressing changes as needed. Then dry hand/arm completely before replacing splint. (When cleaning hand/arm, keep it immobilized in same position until splint is replaced)  PRECAUTIONS/POTENTIAL PROBLEMS: *If you notice or experience increased pain, swelling, numbness, or a lingering reddened area from the splint: Contact your therapist immediately by calling [phone]. You must wear the splint for protection, but we will get you scheduled for adjustments as quickly as possible.  (If only straps or hooks need to be replaced and NO adjustments to the splint need to be made, just call the office ahead and let them know you are coming in)  If you have any medical concerns or signs of infection, please call your doctor immediately

## 2016-10-16 NOTE — Therapy (Signed)
[facility] [address] [place], [place], [address] Phone: [phone]   Fax:  [phone]  Occupational Therapy Evaluation  Patient Details  Name: Omar Glover MRN: [id number] Date of Birth: 01/16/1976 Referring Provider: Dr. Mack Hookavid Thompson  Encounter Date: 10/16/2016      OT End of Session - 10/16/16 1615    Visit Number 1   Authorization Type MCD - awaiting authorization   OT Start Time 1445   OT Stop Time 1600   OT Time Calculation (min) 75 min   Activity Tolerance Patient tolerated treatment well      No past medical history on file.  Past Surgical History:  Procedure Laterality Date  . NERVE, TENDON AND ARTERY REPAIR Left 10/10/2016   Procedure: Left hand Nerve and Flexor Tendon Repair;  Surgeon: Mack Hookavid Thompson, MD;  Location: [facility];  Service: Orthopedics;  Laterality: Left;  . OPEN REDUCTION INTERNAL FIXATION (ORIF) METACARPAL Left 10/10/2016   Procedure: OPEN REDUCTION INTERNAL FIXATION (ORIF) LEFT SECOND METACARPAL;  Surgeon: Mack Hookavid Thompson, MD;  Location: [facility];  Service: Orthopedics;  Laterality: Left;    There were no vitals filed for this visit.      Subjective Assessment - 10/16/16 1455    Pertinent History Lt index flexor tendon repair zone III on 10/10/16   Patient Stated Goals Get my hand better   Currently in Pain? Yes   Pain Score 7    Pain Location Hand   Pain Orientation Left   Pain Descriptors / Indicators Aching   Pain Type Surgical pain   Pain Onset In the past 7 days   Pain Frequency Constant   Aggravating Factors  cold   Pain Relieving Factors meds           [facility] OT Assessment - 10/16/16 0001      Assessment   Diagnosis Lt index flexor tendon repair zone III   Referring Provider Dr. Mack Hookavid Thompson   Onset Date 10/10/16  surgery date   Assessment Pt arrived wrapped from surgery, however distal to PIP joints - fingers free and  unprotected. Pt was instructed NOT to open fingers all the way, but did so several times against instruction not to. Therapist stressed importance of adhering to instructions and precautions d/t potential rupture of tendon. Pt verbalized understanding, but then would continue to open hand with multiple cues not to.    Prior Therapy none     Precautions   Precaution Comments Per Duran flexor tendon repair protocol   Required Braces or Orthoses Other Brace/Splint   Other Brace/Splint Dorsal Block Splint (DBS)      Restrictions   Weight Bearing Restrictions No     Home  Environment   Additional Comments Pt reports friend can help as needed   Lives With Alone     Prior Function   Level of Independence Independent   Vocation Part time employment   Vocation Requirements yardwork     ADL   ADL comments Pt performing BADLS with Rt dominant hand     Written Expression   Dominant Hand Right     Edema   Edema mild to moderate Lt index finger particularly at MP joint                  OT Treatments/Exercises (OP) - 10/16/16 0001      ADLs   ADL Comments Therapist carefully unwrapped post surgical dressing, cleaned hand, cleaned stitches with saline, dryed hand  and applied stockinette prior to splint fabrication. Therapist also instructed pt in precautions and told multiple times t/o session NOT to straighten hand all the way. Pt noted to do several times t/o session against instructions. Pt also told not to grip, lift, carry, push or pull with Lt hand, and use Rt hand for everything except approved exercises issued.      Exercises   Exercises Hand     Hand Exercises   Other Hand Exercises Pt shown passive flexion and active extension ex's within restraints of DBS per early mobilization Duran protocol for zone III tendon repair. Pt verbalized understanding     Splinting   Splinting Fabricated and fitted dorsal block splint (DBS) per zone III flexor tendon repair. Pt educated in  splint wear and care, hygiene care, and practiced donning/doffing splint. Issued splint               OT Education - 10/16/16 1613    Education provided Yes   Education Details hygiene care, precautions, splint wear and care, initial HEP    Person(s) Educated Patient   Methods Explanation;Demonstration;Handout   Comprehension Verbalized understanding;Returned demonstration;Verbal cues required          OT Short Term Goals - 10/16/16 1622      OT SHORT TERM GOAL #1   Title Independent with splint wear and care   Baseline issued, may need review and adjustments   Time 4   Period Weeks   Status On-going     OT SHORT TERM GOAL #2   Title Independent with initial HEP    Baseline Issued, will need updates   Time 4   Period Weeks   Status On-going     OT SHORT TERM GOAL #3   Title Pain less than or equal to 4/10 with initial ex's   Baseline 7/10   Time 4   Period Weeks   Status New           OT Long Term Goals - 10/16/16 1623      OT LONG TERM GOAL #1   Title Independent with updated HEP    Baseline DEPENDENT d/t current precautions   Time 8   Period Weeks   Status New     OT LONG TERM GOAL #2   Title Pt to demo 90% or greater full composite flex/ext for functional tasks   Baseline dependent d/t current precautions   Time 8   Period Weeks   Status New     OT LONG TERM GOAL #3   Title Pt to return to using Lt hand as assist for all bilateral tasks   Baseline dependent d/t current precautions   Time 8   Period Weeks   Status New     OT LONG TERM GOAL #4   Title Pt to demo 30 lbs or greater grip strength Lt hand to assist with opening jars/containers   Baseline dependent d/t current precautions   Time 8   Period Weeks   Status New               Plan - 10/16/16 1617    Clinical Impression Statement Pt is a 40 y.o. male who presents to outpatient rehab s/p Lt index flexor tendon repair, zone III ([phone]) on 10/10/16. Pt arrived  wrapped/protected for splinting purposes and initiation of rehab. Pt did not adhere to precautions t/o session (opening hand fully despite multiple instructions not to and why not to). Pt would benefit from skilled O.T.  to further review/update HEP prn and to follow tendon repair protocol   Rehab Potential Fair   Clinical Impairments Affecting Rehab Potential limited visits from MCD, pt's decreased compliance to precautions   OT Frequency --  requesting 3 visits over 10 week duration from MCD (per flexor tendon repair protocol)    OT Treatment/Interventions Self-care/ADL training;Moist Heat;Fluidtherapy;DME and/or AE instruction;Splinting;Patient/family education;Contrast Bath;Compression bandaging;Therapeutic exercises;Ultrasound;Scar mobilization;Therapeutic activities;Passive range of motion;Parrafin;Electrical Stimulation;Manual Therapy   Plan progress per protocol as indicated   Consulted and Agree with Plan of Care Patient      Patient will benefit from skilled therapeutic intervention in order to improve the following deficits and impairments:  Decreased coordination, Decreased range of motion, Impaired flexibility, Decreased safety awareness, Increased edema, Impaired sensation, Decreased knowledge of precautions, Decreased skin integrity, Impaired UE functional use, Pain, Decreased strength  Visit Diagnosis: Pain in joints of left hand - Plan: Ot plan of care cert/re-cert  Stiffness of left hand, not elsewhere classified - Plan: Ot plan of care cert/re-cert  Muscle weakness (generalized) - Plan: Ot plan of care cert/re-cert  Localized edema - Plan: Ot plan of care cert/re-cert    Problem List There are no active problems to display for this patient.   Kelli Churn, OTR/L 10/16/2016, 4:29 PM  [facility] [address] [place], [place], [address] Phone: [phone]   Fax:  [phone]  Name: Omar Glover MRN: [id number] Date of Birth: August 16, 1976

## 2016-11-11 ENCOUNTER — Ambulatory Visit: Payer: Medicaid Other | Attending: Orthopedic Surgery | Admitting: Occupational Therapy

## 2016-11-21 ENCOUNTER — Ambulatory Visit: Payer: Medicaid Other | Admitting: Occupational Therapy

## 2016-12-04 ENCOUNTER — Ambulatory Visit: Payer: Medicaid Other | Attending: Orthopedic Surgery | Admitting: Occupational Therapy

## 2016-12-08 ENCOUNTER — Encounter: Payer: Self-pay | Admitting: Occupational Therapy

## 2016-12-08 NOTE — Therapy (Signed)
[facility] [address] [place], [place], [address] Phone: [phone]   Fax:  [phone]  Patient Details  Name: Omar Glover MRN: [id number] Date of Birth: August 15, 1976 Referring Provider:  Dr. Milly Jakob  Encounter Date: 12/08/2016   OCCUPATIONAL THERAPY DISCHARGE SUMMARY  Visits from Start of Care: 1  Current functional level related to goals / functional outcomes: Pt met no goals secondary to not returning after initial evaluation and splint fabrication on 10/16/16   Remaining deficits: unknown   Education / Equipment: Splint wear and care, precautions, initial HEP   Plan:                                                    Patient goals were not met. Patient is being discharged due to not returning since the last visit.  ?????       Carey Bullocks, OTR/L 12/08/2016, 2:09 PM  [facility] [address] [place], [place], [address] Phone: [phone]   Fax:  [phone]

## 2019-05-15 ENCOUNTER — Ambulatory Visit (HOSPITAL_COMMUNITY)
Admission: EM | Admit: 2019-05-15 | Discharge: 2019-05-15 | Discharge status: Against Medical Advice | Payer: Medicaid Other | Attending: Family Medicine | Admitting: Family Medicine

## 2019-05-15 ENCOUNTER — Other Ambulatory Visit: Payer: Self-pay

## 2019-05-15 NOTE — ED Triage Notes (Signed)
Pt here with entire family wanted drive up testing; explained protocol and pt decided to seek care at outside facility 

## 2019-06-05 ENCOUNTER — Emergency Department (HOSPITAL_COMMUNITY)
Admission: EM | Admit: 2019-06-05 | Discharge: 2019-06-05 | Disposition: A | Discharge status: Home / Self Care | Payer: Medicaid Other | Attending: Emergency Medicine | Admitting: Emergency Medicine

## 2019-06-05 ENCOUNTER — Emergency Department (HOSPITAL_COMMUNITY): Payer: Medicaid Other

## 2019-06-05 ENCOUNTER — Other Ambulatory Visit: Payer: Self-pay

## 2019-06-05 ENCOUNTER — Encounter (HOSPITAL_COMMUNITY): Payer: Self-pay

## 2019-06-05 DIAGNOSIS — F17210 Nicotine dependence, cigarettes, uncomplicated: Secondary | ICD-10-CM | POA: Diagnosis not present

## 2019-06-05 DIAGNOSIS — M545 Low back pain, unspecified: Secondary | ICD-10-CM

## 2019-06-05 MED ORDER — METHOCARBAMOL 500 MG PO TABS: 500.0000 mg | ORAL_TABLET | Freq: Two times a day (BID) | ORAL | 0 refills | Status: DC

## 2019-06-05 MED ORDER — NAPROXEN 250 MG PO TABS
500.0000 mg | ORAL_TABLET | Freq: Once | ORAL | Status: AC
  Administered 2019-06-05: 500 mg via ORAL
  Filled 2019-06-05: qty 2

## 2019-06-05 MED ORDER — METHOCARBAMOL 500 MG PO TABS
500.0000 mg | ORAL_TABLET | Freq: Once | ORAL | Status: AC
  Administered 2019-06-05: 500 mg via ORAL
  Filled 2019-06-05: qty 1

## 2019-06-05 MED ORDER — NAPROXEN 500 MG PO TABS: 500.0000 mg | ORAL_TABLET | Freq: Two times a day (BID) | ORAL | 0 refills | Status: DC

## 2019-06-05 MED ORDER — METHYLPREDNISOLONE 4 MG PO TBPK: ORAL_TABLET | ORAL | 0 refills | Status: DC

## 2019-06-05 MED ORDER — HYDROCODONE-ACETAMINOPHEN 5-325 MG PO TABS
1.0000 | ORAL_TABLET | Freq: Once | ORAL | Status: AC
  Administered 2019-06-05: 1 via ORAL
  Filled 2019-06-05: qty 1

## 2019-06-05 MED ORDER — LIDOCAINE 5 % EX PTCH
1.0000 | MEDICATED_PATCH | CUTANEOUS | Status: DC
  Administered 2019-06-05: 1 via TRANSDERMAL
  Filled 2019-06-05: qty 1

## 2019-06-05 NOTE — ED Provider Notes (Signed)
[facility]ENC[facility] Provider Note   CSN: [id number] Arrival date & time: 06/05/19  [id number]    History   Chief Complaint Chief Complaint  Patient presents with  . Back Pain    HPI Omar Glover is a 43 y.o. male.     Omar Glover is a 43 y.o. male who is otherwise healthy, presents to the ED for evaluation of low back pain.  Pain started yesterday after he was working, he works Environmental managermoving furniture.  He reports that he was holding a dolly with a large piece of furniture on it, when his coworker lifted the dolly shifting the majority of the weight up towards him and he felt the pain in his back.  Pain is nonradiating, no pain in the legs.  No associated numbness or weakness.  He reports pain is okay when he is sitting still but when he gets up to move or walk pain gets worse.  No loss of bowel or bladder control.  No saddle anesthesia.  No abdominal pain or urinary symptoms.  Has not taken anything for pain prior to arrival.  No other aggravating or alleviating factors.     History reviewed. No pertinent past medical history.  There are no active problems to display for this patient.   Past Surgical History:  Procedure Laterality Date  . NERVE, TENDON AND ARTERY REPAIR Left 10/10/2016   Procedure: Left hand Nerve and Flexor Tendon Repair;  Surgeon: Mack Hookavid Thompson, MD;  Location: [facility];  Service: Orthopedics;  Laterality: Left;  . OPEN REDUCTION INTERNAL FIXATION (ORIF) METACARPAL Left 10/10/2016   Procedure: OPEN REDUCTION INTERNAL FIXATION (ORIF) LEFT SECOND METACARPAL;  Surgeon: Mack Hookavid Thompson, MD;  Location: [facility];  Service: Orthopedics;  Laterality: Left;        Home Medications    Prior to Admission medications   Medication Sig Start Date End Date Taking? Authorizing Provider  methocarbamol (ROBAXIN) 500 MG tablet Take 1 tablet (500 mg total) by mouth 2 (two) times daily. 06/05/19   Dartha LodgeFord, Esperanza Madrazo N, PA-C   methylPREDNISolone (MEDROL DOSEPAK) 4 MG TBPK tablet Take as directed 06/05/19   Dartha LodgeFord, Lenny Bouchillon N, PA-C  naproxen (NAPROSYN) 500 MG tablet Take 1 tablet (500 mg total) by mouth 2 (two) times daily. 10/08/16   Mack Hookhompson, David, MD  naproxen (NAPROSYN) 500 MG tablet Take 1 tablet (500 mg total) by mouth 2 (two) times daily. 06/05/19   Dartha LodgeFord, Braedyn Kauk N, PA-C  oxyCODONE (ROXICODONE) 5 MG immediate release tablet Take 1 tablet (5 mg total) by mouth every 6 (six) hours as needed for severe pain. 10/10/16   Mack Hookhompson, David, MD    Family History No family history on file.  Social History Social History   Tobacco Use  . Smoking status: Current Every Day Smoker    Packs/day: 0.50    Types: Cigarettes  . Smokeless tobacco: Never Used  Substance Use Topics  . Alcohol use: Yes    Comment: occasional - 1 pint liquor per weekend  . Drug use: No     Allergies   Patient has no known allergies.   Review of Systems Review of Systems  Constitutional: Negative for chills and fever.  HENT: Negative.   Respiratory: Negative for shortness of breath.   Cardiovascular: Negative for chest pain.  Gastrointestinal: Negative for abdominal pain, constipation, diarrhea, nausea and vomiting.  Genitourinary: Negative for dysuria, flank pain, frequency and hematuria.  Musculoskeletal: Positive for back pain. Negative for arthralgias, gait problem,  joint swelling, myalgias and neck pain.  Skin: Negative for color change, rash and wound.  Neurological: Negative for weakness and numbness.     Physical Exam Updated Vital Signs BP 93/80 (BP Location: Left Arm)   Pulse 72   Temp 98 F (36.7 C) (Oral)   Resp 20   SpO2 96%   Physical Exam Vitals signs and nursing note reviewed.  Constitutional:      General: He is not in acute distress.    Appearance: Normal appearance. He is well-developed and normal weight. He is not ill-appearing or diaphoretic.  HENT:     Head: Atraumatic.  Eyes:     General:         Right eye: No discharge.        Left eye: No discharge.  Neck:     Musculoskeletal: Neck supple.  Cardiovascular:     Pulses:          Radial pulses are 2+ on the right side and 2+ on the left side.       Dorsalis pedis pulses are 2+ on the right side and 2+ on the left side.       Posterior tibial pulses are 2+ on the right side and 2+ on the left side.  Pulmonary:     Effort: Pulmonary effort is normal. No respiratory distress.  Abdominal:     General: Bowel sounds are normal. There is no distension.     Palpations: Abdomen is soft. There is no mass.     Tenderness: There is no abdominal tenderness. There is no guarding.     Comments: Abdomen soft, nondistended, nontender to palpation in all quadrants without guarding or peritoneal signs, no CVA tenderness bilaterally  Musculoskeletal:     Comments: Tenderness to palpation over midline lumbar spine, no overlying skin changes or palpable deformity..  Pain made worse with range of motion of the lower extremities, negative straight leg raise bilaterally.  Skin:    General: Skin is warm and dry.     Capillary Refill: Capillary refill takes less than 2 seconds.  Neurological:     Mental Status: He is alert and oriented to person, place, and time.     Comments: Alert, clear speech, following commands. Moving all extremities without difficulty. Bilateral lower extremities with 5/5 strength in proximal and distal muscle groups and with dorsi and plantar flexion. Sensation intact in bilateral lower extremities. 2+ patellar DTRs bilaterally. Ambulatory with steady gait  Psychiatric:        Behavior: Behavior normal.      ED Treatments / Results  Labs (all labs ordered are listed, but only abnormal results are displayed) Labs Reviewed - No data to display  EKG None  Radiology Dg Lumbar Spine Complete  Result Date: 06/05/2019 CLINICAL DATA:  Low back pain after lifting injury. EXAM: LUMBAR SPINE - COMPLETE 4+ VIEW COMPARISON:   None. FINDINGS: Five lumbar type vertebral bodies. Sacroiliac joints are symmetric. Maintenance of vertebral body height and alignment. Intervertebral disc heights are maintained. IMPRESSION: No acute osseous abnormality. Electronically Signed   By: Jeronimo GreavesKyle  Talbot M.D.   On: 06/05/2019 13:07    Procedures Procedures (including critical care time)  Medications Ordered in ED Medications  lidocaine (LIDODERM) 5 % 1 patch (1 patch Transdermal Patch Applied 06/05/19 1117)  HYDROcodone-acetaminophen (NORCO/VICODIN) 5-325 MG per tablet 1 tablet (1 tablet Oral Given 06/05/19 1116)  naproxen (NAPROSYN) tablet 500 mg (500 mg Oral Given 06/05/19 1116)  methocarbamol (ROBAXIN) tablet 500 mg (  500 mg Oral Given 06/05/19 1116)     Initial Impression / Assessment and Plan / ED Course  I have reviewed the triage vital signs and the nursing notes.  Pertinent labs & imaging results that were available during my care of the patient were reviewed by me and considered in my medical decision making (see chart for details).  Patient with back pain.  No neurological deficits and normal neuro exam.  Patient can walk but states is painful.  No loss of bowel or bladder control.  No concern for cauda equina.  No fever, night sweats, weight loss, h/o cancer, IVDU.  RICE protocol and pain medicine indicated and discussed with patient.   Final Clinical Impressions(s) / ED Diagnoses   Final diagnoses:  Acute midline low back pain without sciatica    ED Discharge Orders         Ordered    naproxen (NAPROSYN) 500 MG tablet  2 times daily     06/05/19 1354    methylPREDNISolone (MEDROL DOSEPAK) 4 MG TBPK tablet     06/05/19 1354    methocarbamol (ROBAXIN) 500 MG tablet  2 times daily     06/05/19 1354           Janet Berlin 06/05/19 1527    Virgel Manifold, MD 06/08/19 2002

## 2019-06-05 NOTE — Discharge Instructions (Signed)
You were seen here today for Back Pain: Low back pain is discomfort in the lower back that may be due to injuries to muscles and ligaments around the spine. Occasionally, it may be caused by a problem to a part of the spine called a disc. Your back pain should be treated with medicines listed below as well as back exercises and this back pain should get better over the next 2 weeks. Most patients get completely well in 4 weeks. It is important to know however, if you develop severe or worsening pain, low back pain with fever, numbness, weakness or inability to walk or urinate, you should return to the ER immediately.  Please follow up with your doctor this week for a recheck if still having symptoms.  HOME INSTRUCTIONS Self - care:  The application of heat can help soothe the pain.  Maintaining your daily activities, including walking (this is encouraged), as it will help you get better faster than just staying in bed. Do not life, push, pull anything more than 10 pounds for the next week. I am attaching back exercises that you can do at home to help facilitate your recovery.   Back Exercises - I have attached a handout on back exercises that can be done at home to help facilitate your recovery.   Medications are also useful to help with pain control.   Acetaminophen.  This medication is generally safe, and found over the counter. Take as directed for your age. You should not take more than 8 of the extra strength (500mg ) pills a day (max dose is 4000mg  total OVER one day)  Salon Pas Lidocaine Patches: Can be purchased over-the-counter, blue and silver box  Non steroidal anti inflammatory: This includes medications including Ibuprofen, naproxen and Mobic; These medications help both pain and swelling and are very useful in treating back pain.  They should be taken with food, as they can cause stomach upset, and more seriously, stomach bleeding. Do not combine the medications.   Muscle relaxants:   These medications can help with muscle tightness that is a cause of lower back pain.  Most of these medications can cause drowsiness, and it is not safe to drive or use dangerous machinery while taking them. They are primarily helpful when taken at night before sleep.  Prednisone - This is an oral steroid.  This medication is best taken with food in the morning.  Please note that this medication can cause anxiety, mood swings, muscle fatigue, increased hunger, weight gain (sodium/fluid retention), poor sleep as well as other symptoms. If you are a diabetic, please monitor your blood sugars at home as this medication can increase your blood sugars. Call your pharmacist if you have any questions.  You will need to follow up with your primary healthcare provider or the Orthopedist in 1-2 weeks for reassessment and persistent symptoms.  Be aware that if you develop new symptoms, such as a fever, leg weakness, difficulty with or loss of control of your urine or bowels, abdominal pain, or more severe pain, you will need to seek medical attention and/or return to the Emergency department. Additional Information:  Your vital signs today were: BP 103/74    Pulse (!) 58    Temp 98 F (36.7 C) (Oral)    Resp 16    SpO2 100%  If your blood pressure (BP) was elevated above 135/85 this visit, please have this repeated by your doctor within one month. ---------------

## 2019-06-05 NOTE — ED Triage Notes (Signed)
Patient complains of lower back pain x 1 day after injuring at work. 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

## 2019-12-07 ENCOUNTER — Emergency Department (HOSPITAL_COMMUNITY)
Admission: EM | Admit: 2019-12-07 | Discharge: 2019-12-08 | Disposition: A | Discharge status: Home / Self Care | Payer: Medicaid Other | Attending: Emergency Medicine | Admitting: Emergency Medicine

## 2019-12-07 ENCOUNTER — Encounter (HOSPITAL_COMMUNITY): Payer: Self-pay | Admitting: Emergency Medicine

## 2019-12-07 ENCOUNTER — Other Ambulatory Visit: Payer: Self-pay

## 2019-12-07 ENCOUNTER — Emergency Department (HOSPITAL_COMMUNITY): Payer: Medicaid Other

## 2019-12-07 DIAGNOSIS — W1830XA Fall on same level, unspecified, initial encounter: Secondary | ICD-10-CM | POA: Diagnosis not present

## 2019-12-07 DIAGNOSIS — M545 Low back pain, unspecified: Secondary | ICD-10-CM

## 2019-12-07 DIAGNOSIS — F17210 Nicotine dependence, cigarettes, uncomplicated: Secondary | ICD-10-CM | POA: Insufficient documentation

## 2019-12-07 MED ORDER — METHOCARBAMOL 500 MG PO TABS
1000.0000 mg | ORAL_TABLET | Freq: Once | ORAL | Status: AC
  Administered 2019-12-07: 1000 mg via ORAL
  Filled 2019-12-07: qty 2

## 2019-12-07 MED ORDER — PREDNISONE 20 MG PO TABS
60.0000 mg | ORAL_TABLET | Freq: Once | ORAL | Status: AC
  Administered 2019-12-07: 60 mg via ORAL
  Filled 2019-12-07: qty 3

## 2019-12-07 MED ORDER — OXYCODONE HCL 5 MG PO TABS
10.0000 mg | ORAL_TABLET | Freq: Once | ORAL | Status: AC
Start: 2019-12-07 — End: 2019-12-07
  Administered 2019-12-07: 10 mg via ORAL
  Filled 2019-12-07: qty 2

## 2019-12-07 MED ORDER — KETOROLAC TROMETHAMINE 60 MG/2ML IM SOLN
60.0000 mg | Freq: Once | INTRAMUSCULAR | Status: AC
  Administered 2019-12-07: 60 mg via INTRAMUSCULAR
  Filled 2019-12-07: qty 2

## 2019-12-07 NOTE — ED Triage Notes (Signed)
Patient reports injury to lower back with pain this afternoon while lifting heavy furniture , pain increases with movement /changing positions .

## 2019-12-07 NOTE — ED Provider Notes (Signed)
[facility] EMERGENCY DEPARTMENT Provider Note   CSN: [id number] Arrival date & time: 12/07/19  2036     History Chief Complaint  Patient presents with  . Back Pain    Omar Glover is a 44 y.o. male with a hx of no major medical problems presents to the Emergency Department complaining of acute, persistent, progressively worsening low back pain onset 4pm after a fall while trying to help a friend move a couch.  He reports he was on the lower end of the couch when the weight became too much and he fell with the couch on top.  Pt reports immediate pain, but it has worsened throughout the afternoon/evening.  Pt reports he initially walked without difficulty, but his pain is too severe to walk now.  Pt denies numbness, tingling, weakness, loss of bowel or bladder control, saddle anesthesia. Laying in bed/rest makes it better and change of position/walking makes it worse.  Pt reports 2 years ago he had a problem with his disc in his low back which was managed by his PCP in [place].  He never saw an orthopedist.   Patient denies IV drug use, anticoagulant usage, history of back surgeries.    The history is provided by the patient and medical records. No language interpreter was used.       History reviewed. No pertinent past medical history.  There are no problems to display for this patient.   Past Surgical History:  Procedure Laterality Date  . NERVE, TENDON AND ARTERY REPAIR Left 10/10/2016   Procedure: Left hand Nerve and Flexor Tendon Repair;  Surgeon: Mack Hook, MD;  Location: [facility];  Service: Orthopedics;  Laterality: Left;  . OPEN REDUCTION INTERNAL FIXATION (ORIF) METACARPAL Left 10/10/2016   Procedure: OPEN REDUCTION INTERNAL FIXATION (ORIF) LEFT SECOND METACARPAL;  Surgeon: Mack Hook, MD;  Location: [facility];  Service: Orthopedics;  Laterality: Left;       No family history on file.  Social History    Tobacco Use  . Smoking status: Current Every Day Smoker    Packs/day: 0.50    Types: Cigarettes  . Smokeless tobacco: Never Used  Substance Use Topics  . Alcohol use: Yes    Comment: occasional - 1 pint liquor per weekend  . Drug use: No    Home Medications Prior to Admission medications   Medication Sig Start Date End Date Taking? Authorizing Provider  methocarbamol (ROBAXIN) 500 MG tablet Take 1 tablet (500 mg total) by mouth 2 (two) times daily. 12/08/19   Elna Radovich, Dahlia Client, PA-C  naproxen (NAPROSYN) 500 MG tablet Take 1 tablet (500 mg total) by mouth 2 (two) times daily with a meal. 12/08/19   Keiland Pickering, Dahlia Client, PA-C  oxyCODONE (ROXICODONE) 5 MG immediate release tablet Take 1 tablet (5 mg total) by mouth every 6 (six) hours as needed for severe pain. 12/08/19   Kellie Murrill, Dahlia Client, PA-C  predniSONE (DELTASONE) 20 MG tablet 3 tabs po daily x 3 days, then 2 tabs x 3 days, then 1.5 tabs x 3 days, then 1 tab x 3 days, then 0.5 tabs x 3 days 12/08/19   Britanie Harshman, Dahlia Client, PA-C    Allergies    Patient has no known allergies.  Review of Systems   Review of Systems  Constitutional: Negative for fatigue and fever.  Respiratory: Negative for chest tightness and shortness of breath.   Cardiovascular: Negative for chest pain.  Gastrointestinal: Negative for abdominal pain, diarrhea, nausea and vomiting.  Genitourinary:  Negative for dysuria, frequency, hematuria and urgency.  Musculoskeletal: Positive for back pain and gait problem ( 2/2 pain ). Negative for joint swelling, neck pain and neck stiffness.  Skin: Negative for rash.  Neurological: Negative for weakness, light-headedness, numbness and headaches.  All other systems reviewed and are negative.   Physical Exam Updated Vital Signs BP 105/67 (BP Location: Right Arm)   Pulse 78   Temp 98.5 F (36.9 C) (Oral)   Resp 16   SpO2 99%   Physical Exam Vitals and nursing note reviewed.  Constitutional:      General: He  is not in acute distress.    Appearance: He is well-developed. He is not diaphoretic.  HENT:     Head: Normocephalic and atraumatic.     Mouth/Throat:     Pharynx: No oropharyngeal exudate.  Eyes:     Conjunctiva/sclera: Conjunctivae normal.  Neck:     Comments: Full ROM without pain Cardiovascular:     Rate and Rhythm: Normal rate and regular rhythm.     Pulses:          Posterior tibial pulses are 2+ on the right side and 2+ on the left side.  Pulmonary:     Effort: Pulmonary effort is normal. No respiratory distress.  Abdominal:     General: There is no distension.     Palpations: Abdomen is soft.     Tenderness: There is no abdominal tenderness.  Musculoskeletal:     Cervical back: Normal range of motion and neck supple.     Comments: Decreased range of motion of the T-spine and L-spine due to pain Tenderness to palpation of the paraspinous muscles of the L-spine and some midline tenderness over L5/S1.  No palpable deformity  Lymphadenopathy:     Cervical: No cervical adenopathy.  Skin:    General: Skin is warm and dry.     Findings: No erythema or rash.  Neurological:     Mental Status: He is alert.     Comments: Speech is clear and goal oriented, follows commands Normal 5/5 strength in upper and lower extremities bilaterally including dorsiflexion and plantar flexion, strong and equal grip strength Sensation normal to light and sharp touch Moves extremities without ataxia, coordination intact Pt able to sit on the side of the bed but, unable to stand/walk on initial exam given severe pain  Psychiatric:        Behavior: Behavior normal.     ED Results / Procedures / Treatments    Radiology DG Lumbar Spine Complete  Result Date: 12/07/2019 CLINICAL DATA:  Injured 2 years ago, paralysis EXAM: LUMBAR SPINE - COMPLETE 4+ VIEW COMPARISON:  06/05/2019 FINDINGS: Frontal, bilateral oblique, lateral views of the lumbar spine are obtained. Alignment is anatomic. No acute  displaced fractures. Disc spaces are well preserved. Sacroiliac joints are normal. IMPRESSION: 1. Unremarkable lumbar spine. Electronically Signed   By: Randa Ngo M.D.   On: 12/07/2019 21:41    Procedures Procedures (including critical care time)  Medications Ordered in ED Medications  oxyCODONE (Oxy IR/ROXICODONE) immediate release tablet 10 mg (10 mg Oral Given 12/07/19 2330)  methocarbamol (ROBAXIN) tablet 1,000 mg (1,000 mg Oral Given 12/07/19 2330)  predniSONE (DELTASONE) tablet 60 mg (60 mg Oral Given 12/07/19 2330)  ketorolac (TORADOL) injection 60 mg (60 mg Intramuscular Given 12/07/19 2330)    ED Course  I have reviewed the triage vital signs and the nursing notes.  Pertinent labs & imaging results that were available during my care  of the patient were reviewed by me and considered in my medical decision making (see chart for details).    MDM Rules/Calculators/A&P                       Patient presents with acute onset back pain.  Patient did have a fall.  L-spine plain films without acute abnormality.  I personally evaluated these images.  No evidence of fracture.  Disc spaces appear to be maintained.  Doubt cauda equina.  Patient has no saddle anesthesia, full sensation in his legs, normal strength and denies loss of bowel or bladder control.  On initial exam, he is unable to walk due to severe pain.  Will give pain control and reassess.  12:14 AM Pain improved after treatment here in the emergency department.  He is able to ambulate without assistance.  He does request crutches and states he believes this will help his back.  Will give crutches and have orthopedic follow-up.  Final Clinical Impression(s) / ED Diagnoses Final diagnoses:  Acute midline low back pain without sciatica    Rx / DC Orders ED Discharge Orders         Ordered    naproxen (NAPROSYN) 500 MG tablet  2 times daily with meals     12/08/19 0040    methocarbamol (ROBAXIN) 500 MG tablet  2 times  daily     12/08/19 0040    predniSONE (DELTASONE) 20 MG tablet     12/08/19 0040    oxyCODONE (ROXICODONE) 5 MG immediate release tablet  Every 6 hours PRN,   Status:  Discontinued     12/08/19 0040    oxyCODONE (ROXICODONE) 5 MG immediate release tablet  Every 6 hours PRN     12/08/19 0041           [place] Alfredo, Dahlia Client, PA-C 12/08/19 0042    Charlynne Pander, MD 12/08/19 [phone]

## 2019-12-08 ENCOUNTER — Other Ambulatory Visit: Payer: Self-pay

## 2019-12-08 ENCOUNTER — Emergency Department (HOSPITAL_COMMUNITY)
Admission: EM | Admit: 2019-12-08 | Discharge: 2019-12-08 | Disposition: A | Discharge status: Home / Self Care | Payer: Medicaid Other | Source: Home / Self Care | Attending: Emergency Medicine | Admitting: Emergency Medicine

## 2019-12-08 ENCOUNTER — Encounter (HOSPITAL_COMMUNITY): Payer: Self-pay | Admitting: Emergency Medicine

## 2019-12-08 DIAGNOSIS — M545 Low back pain, unspecified: Secondary | ICD-10-CM

## 2019-12-08 DIAGNOSIS — N3000 Acute cystitis without hematuria: Secondary | ICD-10-CM | POA: Insufficient documentation

## 2019-12-08 DIAGNOSIS — F17210 Nicotine dependence, cigarettes, uncomplicated: Secondary | ICD-10-CM | POA: Insufficient documentation

## 2019-12-08 LAB — URINALYSIS, ROUTINE W REFLEX MICROSCOPIC
Bacteria, UA: NONE SEEN
Glucose, UA: NEGATIVE mg/dL
Hgb urine dipstick: NEGATIVE
Ketones, ur: 20 mg/dL — AB
Nitrite: NEGATIVE
Protein, ur: 30 mg/dL — AB
Specific Gravity, Urine: 1.042 — ABNORMAL HIGH (ref 1.005–1.030)
pH: 5 (ref 5.0–8.0)

## 2019-12-08 MED ORDER — FOSFOMYCIN TROMETHAMINE 3 G PO PACK
3.0000 g | PACK | Freq: Once | ORAL | Status: AC
  Administered 2019-12-08: 07:00:00 3 g via ORAL
  Filled 2019-12-08: qty 3

## 2019-12-08 MED ORDER — METHOCARBAMOL 500 MG PO TABS: 500.0000 mg | ORAL_TABLET | Freq: Two times a day (BID) | ORAL | 0 refills | Status: DC

## 2019-12-08 MED ORDER — OXYCODONE HCL 5 MG PO TABS: 5.0000 mg | ORAL_TABLET | Freq: Four times a day (QID) | ORAL | 0 refills | Status: DC | PRN

## 2019-12-08 MED ORDER — DEXAMETHASONE SODIUM PHOSPHATE 10 MG/ML IJ SOLN
10.0000 mg | Freq: Once | INTRAMUSCULAR | Status: AC
  Administered 2019-12-08: 04:00:00 10 mg via INTRAMUSCULAR
  Filled 2019-12-08: qty 1

## 2019-12-08 MED ORDER — PREDNISONE 20 MG PO TABS: ORAL_TABLET | ORAL | 0 refills | Status: DC

## 2019-12-08 MED ORDER — NAPROXEN 500 MG PO TABS: 500.0000 mg | ORAL_TABLET | Freq: Two times a day (BID) | ORAL | 0 refills | Status: DC

## 2019-12-08 MED ORDER — LIDOCAINE 5 % EX PTCH: 1.0000 | MEDICATED_PATCH | CUTANEOUS | 0 refills | Status: DC

## 2019-12-08 MED ORDER — LIDOCAINE 5 % EX PTCH
2.0000 | MEDICATED_PATCH | CUTANEOUS | Status: DC
  Administered 2019-12-08: 07:00:00 2 via TRANSDERMAL
  Filled 2019-12-08: qty 2

## 2019-12-08 MED ORDER — ACETAMINOPHEN 500 MG PO TABS
1000.0000 mg | ORAL_TABLET | Freq: Once | ORAL | Status: AC
  Administered 2019-12-08: 04:00:00 1000 mg via ORAL
  Filled 2019-12-08: qty 2

## 2019-12-08 NOTE — ED Provider Notes (Signed)
[facility] EMERGENCY DEPARTMENT Provider Note   CSN: [id number] Arrival date & time: 12/08/19  0351     History Chief Complaint  Patient presents with  . Back Pain    Omar Glover is a 44 y.o. male.  The history is provided by the patient.  Back Pain Location:  Sacro-iliac joint Quality:  Stabbing Radiates to:  Does not radiate Pain severity:  Severe Pain is:  Same all the time Onset quality:  Sudden Timing:  Constant Progression:  Waxing and waning Chronicity:  New Context: falling   Relieved by:  Nothing Worsened by:  Nothing Ineffective treatments:  Ibuprofen and narcotics Associated symptoms: no abdominal pain, no abdominal swelling, no bladder incontinence, no bowel incontinence, no chest pain, no dysuria, no fever, no headaches, no leg pain, no numbness, no paresthesias, no pelvic pain, no perianal numbness, no tingling, no weakness and no weight loss   Risk factors: no hx of cancer   Seen for same this evening and had negative Xrays and was discharged but it hurts every time he moves so he checked back in.  No weakness, no numbness no changes in bowel or bladder habits.      History reviewed. No pertinent past medical history.  There are no problems to display for this patient.   Past Surgical History:  Procedure Laterality Date  . NERVE, TENDON AND ARTERY REPAIR Left 10/10/2016   Procedure: Left hand Nerve and Flexor Tendon Repair;  Surgeon: Mack Hook, MD;  Location: [facility];  Service: Orthopedics;  Laterality: Left;  . OPEN REDUCTION INTERNAL FIXATION (ORIF) METACARPAL Left 10/10/2016   Procedure: OPEN REDUCTION INTERNAL FIXATION (ORIF) LEFT SECOND METACARPAL;  Surgeon: Mack Hook, MD;  Location: [facility];  Service: Orthopedics;  Laterality: Left;       History reviewed. No pertinent family history.  Social History   Tobacco Use  . Smoking status: Current Every Day Smoker    Packs/day:  0.50    Types: Cigarettes  . Smokeless tobacco: Never Used  Substance Use Topics  . Alcohol use: Yes    Comment: occasional - 1 pint liquor per weekend  . Drug use: No    Home Medications Prior to Admission medications   Medication Sig Start Date End Date Taking? Authorizing Provider  lidocaine (LIDODERM) 5 % Place 1 patch onto the skin daily. Remove & Discard patch within 12 hours or as directed by MD 12/08/19   Nicanor Alcon, Melah Ebling, MD  methocarbamol (ROBAXIN) 500 MG tablet Take 1 tablet (500 mg total) by mouth 2 (two) times daily. 12/08/19   Muthersbaugh, Dahlia Client, PA-C  naproxen (NAPROSYN) 500 MG tablet Take 1 tablet (500 mg total) by mouth 2 (two) times daily with a meal. 12/08/19   Muthersbaugh, Dahlia Client, PA-C  oxyCODONE (ROXICODONE) 5 MG immediate release tablet Take 1 tablet (5 mg total) by mouth every 6 (six) hours as needed for severe pain. 12/08/19   Muthersbaugh, Dahlia Client, PA-C  predniSONE (DELTASONE) 20 MG tablet 3 tabs po daily x 3 days, then 2 tabs x 3 days, then 1.5 tabs x 3 days, then 1 tab x 3 days, then 0.5 tabs x 3 days 12/08/19   Muthersbaugh, Dahlia Client, PA-C    Allergies    Patient has no known allergies.  Review of Systems   Review of Systems  Constitutional: Negative for fever and weight loss.  HENT: Negative for congestion.   Eyes: Negative for visual disturbance.  Respiratory: Negative for shortness of breath.  Cardiovascular: Negative for chest pain.  Gastrointestinal: Negative for abdominal pain and bowel incontinence.  Genitourinary: Negative for bladder incontinence, dysuria and pelvic pain.  Musculoskeletal: Positive for back pain. Negative for gait problem and joint swelling.  Neurological: Negative for tingling, weakness, numbness, headaches and paresthesias.  All other systems reviewed and are negative.   Physical Exam Updated Vital Signs BP 116/83   Pulse 66   Wt 74.4 kg   SpO2 100%   BMI 22.25 kg/m   Physical Exam Vitals and nursing note reviewed.    Constitutional:      General: He is not in acute distress.    Appearance: Normal appearance.  HENT:     Head: Normocephalic and atraumatic.     Nose: Nose normal.  Eyes:     Conjunctiva/sclera: Conjunctivae normal.     Pupils: Pupils are equal, round, and reactive to light.  Cardiovascular:     Rate and Rhythm: Normal rate and regular rhythm.     Pulses: Normal pulses.     Heart sounds: Normal heart sounds.  Pulmonary:     Effort: Pulmonary effort is normal.     Breath sounds: Normal breath sounds.  Abdominal:     General: Abdomen is flat. Bowel sounds are normal.     Tenderness: There is no abdominal tenderness. There is no guarding or rebound.  Musculoskeletal:        General: Normal range of motion.     Cervical back: Normal, normal range of motion and neck supple. No rigidity.     Thoracic back: Normal.     Lumbar back: Normal.       Back:  Skin:    General: Skin is warm and dry.     Capillary Refill: Capillary refill takes less than 2 seconds.  Neurological:     General: No focal deficit present.     Mental Status: He is alert and oriented to person, place, and time.     Deep Tendon Reflexes: Reflexes normal.  Psychiatric:        Mood and Affect: Mood normal.        Behavior: Behavior normal.     ED Results / Procedures / Treatments   Labs (all labs ordered are listed, but only abnormal results are displayed) Labs Reviewed  URINALYSIS, ROUTINE W REFLEX MICROSCOPIC    EKG None  Radiology DG Lumbar Spine Complete  Result Date: 12/07/2019 CLINICAL DATA:  Injured 2 years ago, paralysis EXAM: LUMBAR SPINE - COMPLETE 4+ VIEW COMPARISON:  06/05/2019 FINDINGS: Frontal, bilateral oblique, lateral views of the lumbar spine are obtained. Alignment is anatomic. No acute displaced fractures. Disc spaces are well preserved. Sacroiliac joints are normal. IMPRESSION: 1. Unremarkable lumbar spine. Electronically Signed   By: Randa Ngo M.D.   On: 12/07/2019 21:41     Procedures Procedures (including critical care time)  Medications Ordered in ED Medications  lidocaine (LIDODERM) 5 % 2 patch (has no administration in time range)  dexamethasone (DECADRON) injection 10 mg (10 mg Intramuscular Given 12/08/19 0427)  acetaminophen (TYLENOL) tablet 1,000 mg (1,000 mg Oral Given 12/08/19 0427)    ED Course  I have reviewed the triage vital signs and the nursing notes.  Pertinent labs & imaging results that were available during my care of the patient were reviewed by me and considered in my medical decision making (see chart for details).  Walking and urinated on own.  Treated for mild UTI.    Keni Elison was evaluated in Emergency Department  on 12/08/2019 for the symptoms described in the history of present illness. He was evaluated in the context of the global COVID-19 pandemic, which necessitated consideration that the patient might be at risk for infection with the SARS-CoV-2 virus that causes COVID-19. Institutional protocols and algorithms that pertain to the evaluation of patients at risk for COVID-19 are in a state of rapid change based on information released by regulatory bodies including the CDC and federal and state organizations. These policies and algorithms were followed during the patient's care in the ED.  Final Clinical Impression(s) / ED Diagnoses Final diagnoses:  None   Return for weakness, numbness, changes in vision or speech, fevers >100.4 unrelieved by medication, shortness of breath, intractable vomiting, or diarrhea, abdominal pain, Inability to tolerate liquids or food, cough, altered mental status or any concerns. No signs of systemic illness or infection. The patient is nontoxic-appearing on exam and vital signs are within normal limits.   I have reviewed the triage vital signs and the nursing notes. Pertinent labs &imaging results that were available during my care of the patient were reviewed by me and considered in my  medical decision making (see chart for details).  After history, exam, and medical workup I feel the patient has been appropriately medically screened and is safe for discharge home. Pertinent diagnoses were discussed with the patient. Patient was given return precautions Rx / DC Orders ED Discharge Orders         Ordered    lidocaine (LIDODERM) 5 %  Every 24 hours     12/08/19 0405           Tiffanyann Deroo, MD 12/08/19 [phone]

## 2019-12-08 NOTE — Discharge Instructions (Addendum)
1. Medications: robaxin, naproxyn, prednisone, roxicodone (only for severe pain), usual home medications 2. Treatment: rest, drink plenty of fluids, gentle stretching as discussed, alternate ice and heat 3. Follow Up: Please followup with your primary doctor in 3 days for discussion of your diagnoses and further evaluation after today's visit; if you do not have a primary care doctor use the resource guide provided to find one;  Return to the ER for worsening back pain, difficulty walking, loss of bowel or bladder control or other concerning symptoms

## 2019-12-08 NOTE — ED Triage Notes (Signed)
Pt seen and discharged last night for back pain. Returns w/crutches, states he cannot walk d/t pain.

## 2020-07-10 IMAGING — CR DG LUMBAR SPINE COMPLETE 4+V
5 series · 5 of 5 positions shown · non-contrast
Comparison: 06/05/2019

CLINICAL DATA: Injured 2 years ago, paralysis

EXAM:
LUMBAR SPINE - COMPLETE 4+ VIEW

[l-spine ap]
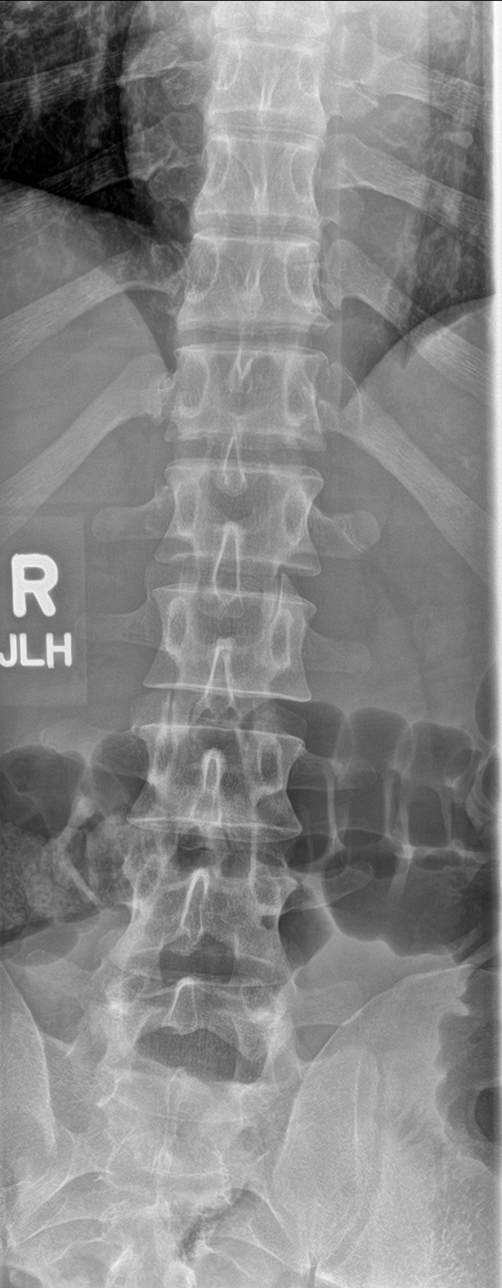

[l-spine obl (1 of 2)]
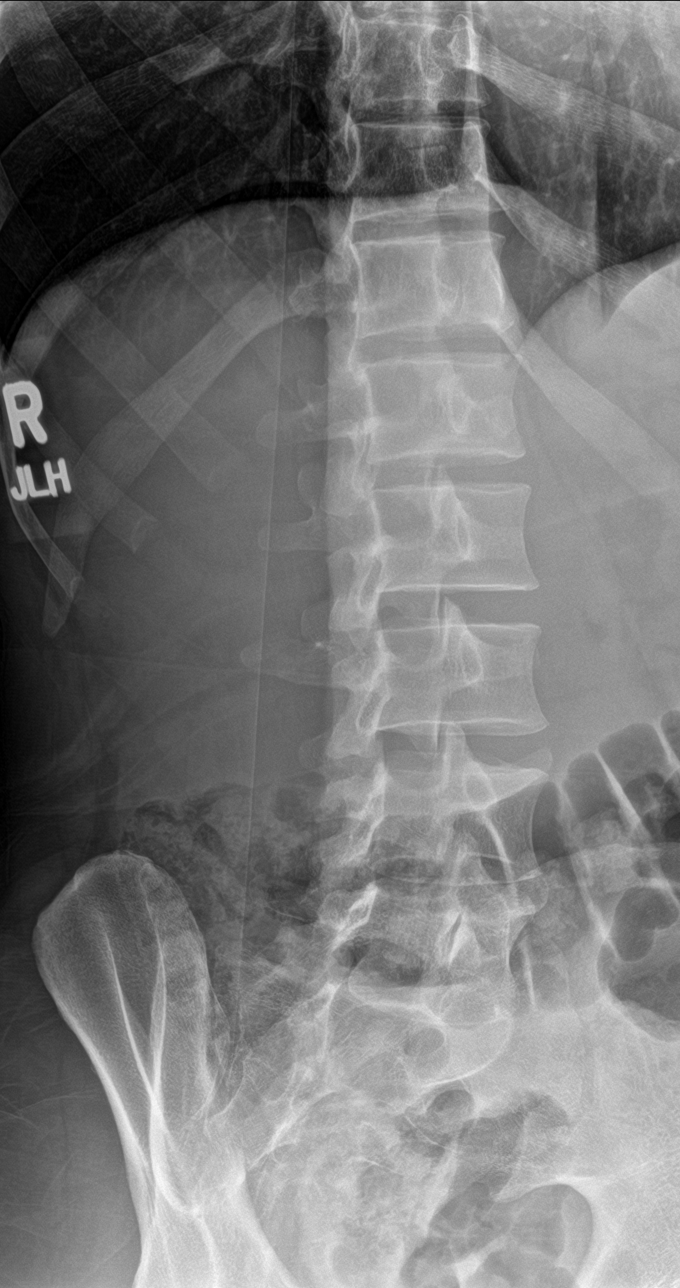

[l-spine obl (2 of 2)]
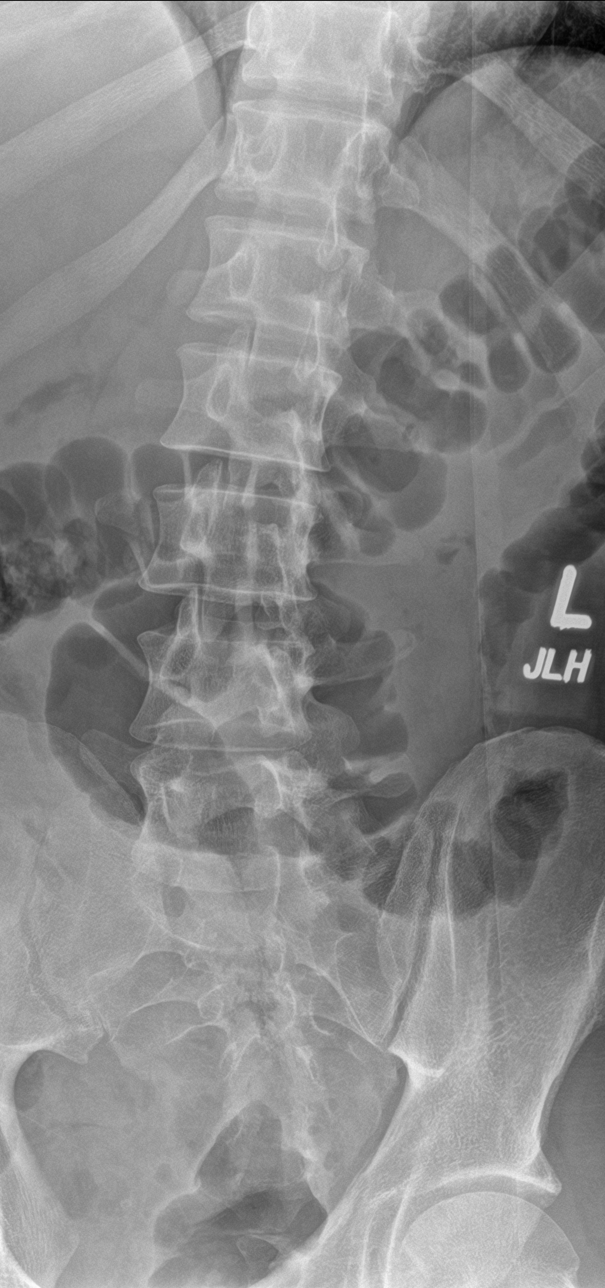

[l-spine lat]
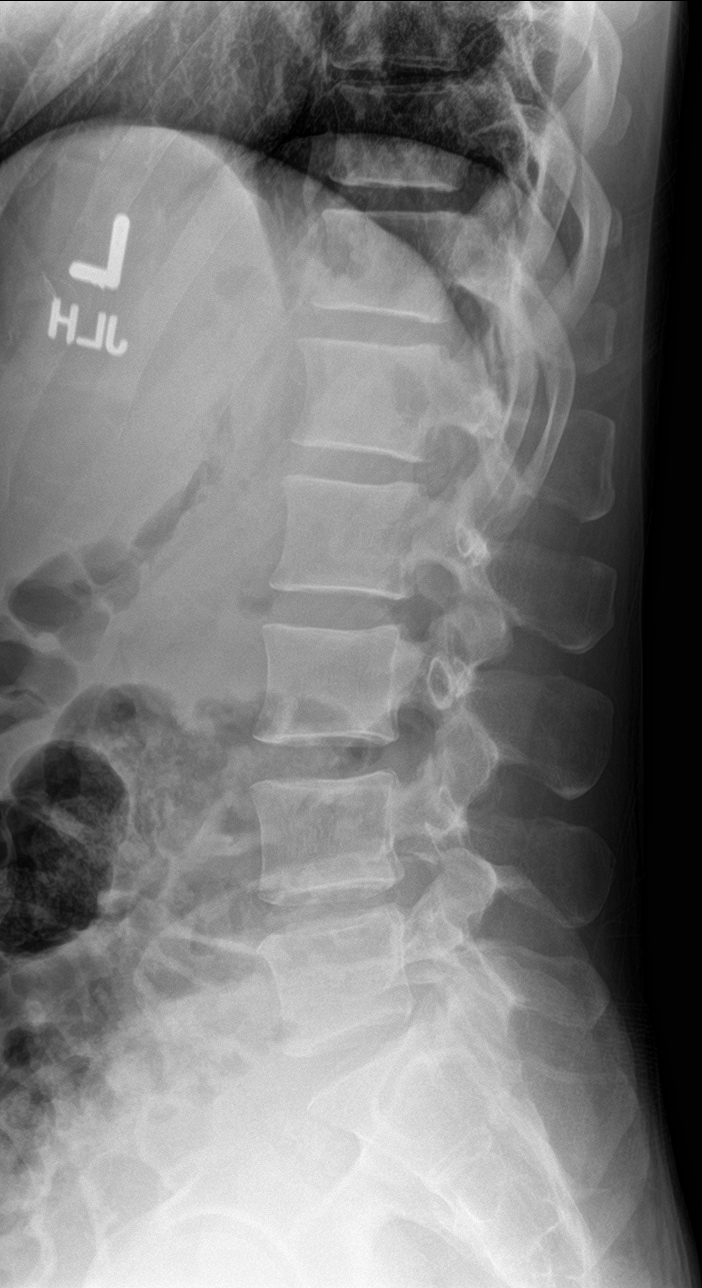

[l-spine spot]
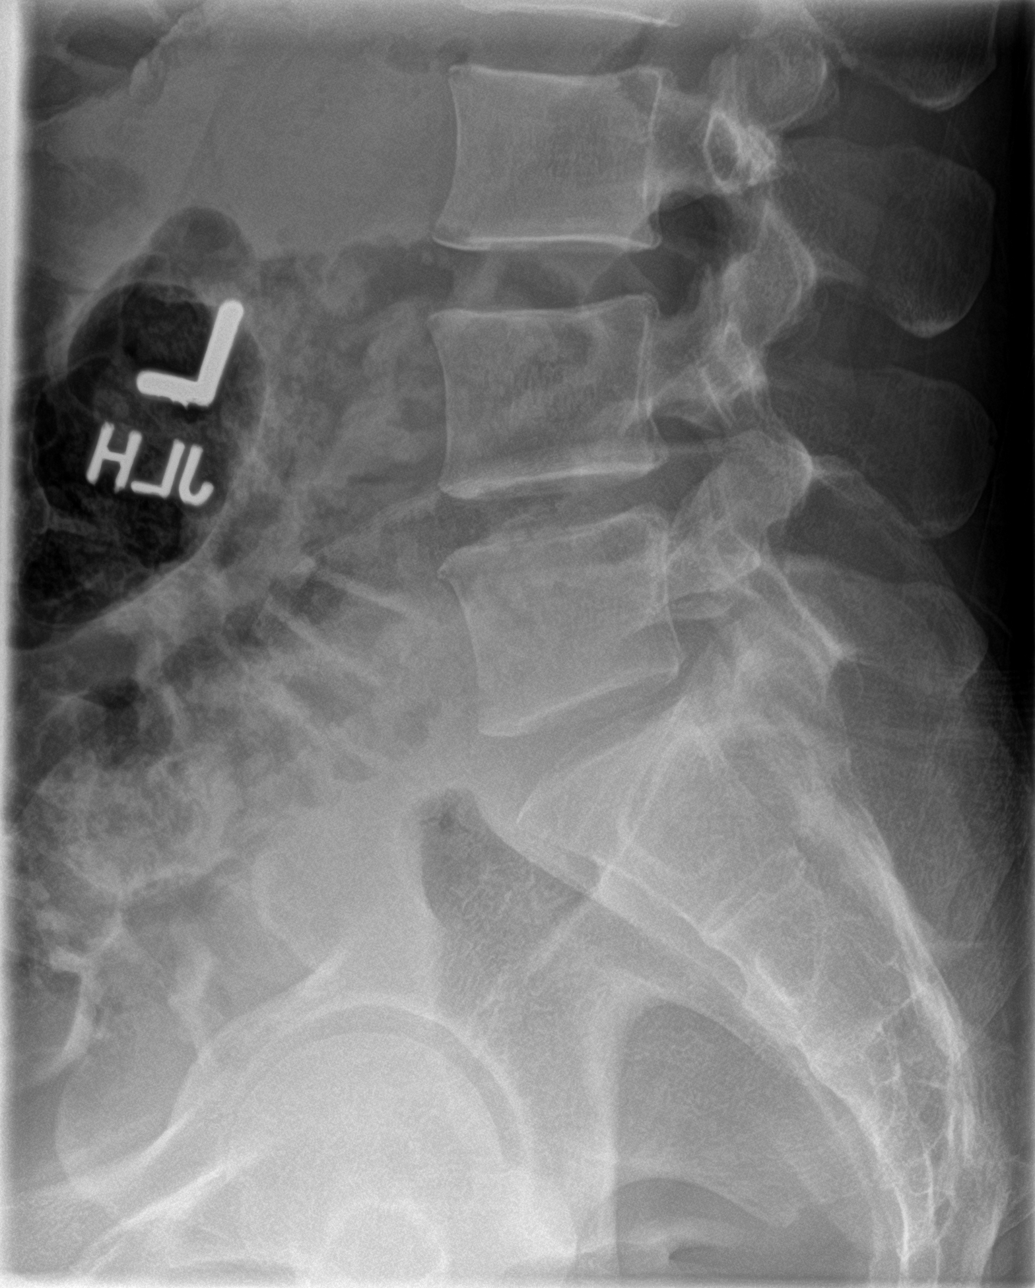

[5 of 5 positions shown; findings below may reference images not displayed]

FINDINGS: Frontal, bilateral oblique, lateral views of the lumbar spine are
obtained. Alignment is anatomic. No acute displaced fractures. Disc
spaces are well preserved. Sacroiliac joints are normal.
IMPRESSION: 1. Unremarkable lumbar spine.

## 2020-08-07 ENCOUNTER — Ambulatory Visit (HOSPITAL_COMMUNITY)
Admission: EM | Admit: 2020-08-07 | Discharge: 2020-08-07 | Disposition: A | Discharge status: Home / Self Care | Payer: Medicaid Other | Attending: Family Medicine | Admitting: Family Medicine

## 2020-08-07 ENCOUNTER — Other Ambulatory Visit: Payer: Self-pay

## 2020-08-07 ENCOUNTER — Encounter (HOSPITAL_COMMUNITY): Payer: Self-pay

## 2020-08-07 DIAGNOSIS — K4030 Unilateral inguinal hernia, with obstruction, without gangrene, not specified as recurrent: Secondary | ICD-10-CM

## 2020-08-07 MED ORDER — TRAMADOL-ACETAMINOPHEN 37.5-325 MG PO TABS: 2.0000 | ORAL_TABLET | Freq: Four times a day (QID) | ORAL | 0 refills | Status: AC | PRN

## 2020-08-07 MED ORDER — IBUPROFEN 800 MG PO TABS: 800.0000 mg | ORAL_TABLET | Freq: Three times a day (TID) | ORAL | 0 refills | Status: AC

## 2020-08-07 NOTE — Discharge Instructions (Signed)
No heavy lifting Take the ibuprofen for moderate pain Take the  ultracet  as needed more severe pain The surgery office will call you with an appointment. GO TO ER if you develop severe pain, nausea or vomiting, fever

## 2020-08-07 NOTE — ED Provider Notes (Signed)
[facility]    CSN: [id number] Arrival date & time: 08/07/20  1509      History   Chief Complaint Chief Complaint  Patient presents with  . Hernia    HPI Omar Glover is a 44 y.o. male.   HPI  Patient was diagnosed with a hernia in 2016.  He states since then it is gotten quite large.  Now he has a very large lump in his left groin.  It never goes away completely.  At times it becomes larger.  It hurts with any kind of lifting or straining.  Normal bowels.  Normal ability to pass urine.  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  It is becoming increasingly difficult for him to do his job because the pain.  He is decided at this time that he should have his hernia repaired.  History reviewed. No pertinent past medical history.  There are no problems to display for this patient.   Past Surgical History:  Procedure Laterality Date  . NERVE, TENDON AND ARTERY REPAIR Left 10/10/2016   Procedure: Left hand Nerve and Flexor Tendon Repair;  Surgeon: Mack Hook, MD;  Location: [facility];  Service: Orthopedics;  Laterality: Left;  . OPEN REDUCTION INTERNAL FIXATION (ORIF) METACARPAL Left 10/10/2016   Procedure: OPEN REDUCTION INTERNAL FIXATION (ORIF) LEFT SECOND METACARPAL;  Surgeon: Mack Hook, MD;  Location: [facility];  Service: Orthopedics;  Laterality: Left;       Home Medications    Prior to Admission medications   Medication Sig Start Date End Date Taking? Authorizing Provider  ibuprofen (ADVIL) 800 MG tablet Take 1 tablet (800 mg total) by mouth 3 (three) times daily. 08/07/20   Eustace Moore, MD  traMADol-acetaminophen (ULTRACET) 37.5-325 MG tablet Take 2 tablets by mouth every 6 (six) hours as needed. 08/07/20   Eustace Moore, MD    Family History Family History  Problem Relation Age of Onset  . Healthy Mother     Social History Social History   Tobacco Use  . Smoking status:  Current Every Day Smoker    Packs/day: 0.50    Types: Cigarettes  . Smokeless tobacco: Never Used  Vaping Use  . Vaping Use: Never used  Substance Use Topics  . Alcohol use: Yes    Comment: occasional - 1 pint liquor per weekend  . Drug use: No     Allergies   Patient has no known allergies.   Review of Systems Review of Systems See HPI  Physical Exam Triage Vital Signs ED Triage Vitals  Enc Vitals Group     BP 08/07/20 1625 102/70     Pulse Rate 08/07/20 1625 74     Resp 08/07/20 1625 18     Temp 08/07/20 1625 98 F (36.7 C)     Temp Source 08/07/20 1625 Oral     SpO2 08/07/20 1625 100 %     Weight --      Height --      Head Circumference --      Peak Flow --      Pain Score 08/07/20 1624 8     Pain Loc --      Pain Edu? --      Excl. in GC? --    No data found.  Updated Vital Signs BP 102/70 (BP Location: Right Arm)   Pulse 74   Temp 98 F (36.7 C) (Oral)  Resp 18   SpO2 100%      Physical Exam Constitutional:      General: He is not in acute distress.    Appearance: Normal appearance. He is well-developed and normal weight.     Comments: Lean in appearance.  No acute distress  HENT:     Head: Normocephalic and atraumatic.     Nose:     Comments: Mask is in place Eyes:     Conjunctiva/sclera: Conjunctivae normal.     Pupils: Pupils are equal, round, and reactive to light.  Cardiovascular:     Rate and Rhythm: Normal rate and regular rhythm.     Heart sounds: Normal heart sounds.  Pulmonary:     Effort: Pulmonary effort is normal. No respiratory distress.     Breath sounds: Normal breath sounds.  Abdominal:     General: Bowel sounds are normal. There is no distension.     Palpations: Abdomen is soft.    Musculoskeletal:        General: Normal range of motion.     Cervical back: Normal range of motion.  Skin:    General: Skin is warm and dry.  Neurological:     Mental Status: He is alert.  Psychiatric:        Mood and Affect: Mood  normal.        Behavior: Behavior normal.      UC Treatments / Results  Labs (all labs ordered are listed, but only abnormal results are displayed) Labs Reviewed - No data to display  EKG   Radiology No results found.  Procedures Procedures (including critical care time)  Medications Ordered in UC Medications - No data to display  Initial Impression / Assessment and Plan / UC Course  I have reviewed the triage vital signs and the nursing notes.  Pertinent labs & imaging results that were available during my care of the patient were reviewed by me and considered in my medical decision making (see chart for details).     Discussed with Dr. Gaynelle Adu of [facility].  He has a doctor on call.  He states the office will call him tomorrow to set up an appointment within the next week or 2.  He explained that this is likely an incarcerated hernia that is not currently obstructed.  Patient is advised to go to the ER should you have worsening symptoms.  Going to pull him out of work so he does not have to do heavy lifting.  Going to give him ibuprofen for pain.  Cautioned regarding signs of worsening condition Final Clinical Impressions(s) / UC Diagnoses   Final diagnoses:  Incarcerated inguinal hernia     Discharge Instructions     No heavy lifting Take the ibuprofen for moderate pain Take the  ultracet  as needed more severe pain The surgery office will call you with an appointment. GO TO ER if you develop severe pain, nausea or vomiting, fever   ED Prescriptions    Medication Sig Dispense Auth. Provider   ibuprofen (ADVIL) 800 MG tablet Take 1 tablet (800 mg total) by mouth 3 (three) times daily. 21 tablet Eustace Moore, MD   traMADol-acetaminophen (ULTRACET) 37.5-325 MG tablet Take 2 tablets by mouth every 6 (six) hours as needed. 20 tablet Eustace Moore, MD     I have reviewed the PDMP during this encounter.   Eustace Moore,  MD 08/07/20 [place]

## 2020-08-07 NOTE — ED Triage Notes (Signed)
Patient in with c/o painful hernia located in left groin area that he says has increased in size over the last 5 months.  States that it takes his breath away if he doesn't apply pressure and it makes him feels nauseous  Has not taken any medication for the pain  Says if he lifts or strains too hard the pain is worse  Denies numbness or tingling in area

## 2020-08-31 ENCOUNTER — Ambulatory Visit: Payer: Self-pay | Admitting: General Surgery

## 2020-08-31 ENCOUNTER — Ambulatory Visit (HOSPITAL_COMMUNITY)
Admission: EM | Admit: 2020-08-31 | Discharge: 2020-08-31 | Disposition: A | Discharge status: Home / Self Care | Payer: Medicaid Other

## 2020-08-31 ENCOUNTER — Other Ambulatory Visit: Payer: Self-pay

## 2020-08-31 NOTE — H&P (Signed)
Orpah Melter Appointment: 08/31/2020 2:15 PM Location: [facility] Patient #: [id number] DOB: 1975/12/17 Single / Language: [place] / Race: Black or African American Male  History of Present Illness Minerva Areola M. Jaidyn Usery MD; 08/31/2020 3:02 PM) The patient is a 44 year old male who presents with an inguinal hernia. Patient is referred by the emergency room for evaluation of a large incarcerated left inguinal hernia. He states that he's had a hernia for at least 4 years however Really large about 2 years ago. It causes some discomfort. He denies any neuropathic pain. He states that it tends to bother him completely afternoon which she describes as a tugging or pulling sensation. No nausea, vomiting, diarrhea or constipation. No prior bowel surgery. He does smoke.  Does last 2-3 days. No nocturia. Chest pain, shortness of breath, dyspnea on exertion. No prior heart issues.   Problem List/Past Medical Minerva Areola M. 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 Andrey Campanile, MD; 08/31/2020 3:02 PM) No pertinent past surgical history  Diagnostic Studies History Minerva Areola M. Andrey Campanile, MD; 08/31/2020 3:02 PM) Colonoscopy never  Allergies Renee Ramus, CMA; 08/31/2020 2:33 PM) No Known Drug Allergies [08/31/2020]:  Medication History Renee Ramus, CMA; 08/31/2020 2:33 PM) No Current Medications Medications Reconciled  Other Problems Minerva Areola M. Andrey Campanile, MD; 08/31/2020 3:02 PM) Back Pain     Review of Systems Minerva Areola M. Kataleah Bejar MD; 08/31/2020 3:02 PM) General Not Present- Appetite Loss, Chills, Fatigue, Fever, Night Sweats, Weight Gain and Weight Loss. Skin Present- Change in Wart/Mole and Dryness. Not Present- Hives, Jaundice, New Lesions, Non-Healing Wounds, Rash and Ulcer. HEENT Not Present- Earache, Hearing Loss, Hoarseness, Nose Bleed, Oral Ulcers, Ringing in the Ears, Seasonal Allergies, Sinus Pain, Sore Throat, Visual  Disturbances, Wears glasses/contact lenses and Yellow Eyes. Respiratory Present- Difficulty Breathing. Not Present- Bloody sputum, Chronic Cough, Snoring and Wheezing. Gastrointestinal Present- Bloody Stool. Not Present- Abdominal Pain, Bloating, Change in Bowel Habits, Chronic diarrhea, Constipation, Difficulty Swallowing, Excessive gas, Gets full quickly at meals, Hemorrhoids, Indigestion, Nausea, Rectal Pain and Vomiting. Male Genitourinary Not Present- Blood in Urine, Change in Urinary Stream, Frequency, Impotence, Nocturia, Painful Urination, Urgency and Urine Leakage. Neurological Not Present- Decreased Memory, Fainting, Headaches, Numbness, Seizures, Tingling, Tremor, Trouble walking and Weakness. Psychiatric Not Present- Anxiety, Bipolar, Change in Sleep Pattern, Depression, Fearful and Frequent crying. Endocrine Not Present- Cold Intolerance, Excessive Hunger, Hair Changes, Heat Intolerance, Hot flashes and New Diabetes. Hematology Not Present- Blood Thinners, Easy Bruising, Excessive bleeding, Gland problems, HIV and Persistent Infections.  Vitals (Armen Ferguson CMA; 08/31/2020 2:33 PM) 08/31/2020 2:33 PM Weight: 164.13 lb Height: 72in Body Surface Area: 1.96 m Body Mass Index: 22.26 kg/m  Temp.: 98.8F  Pulse: 77 (Regular)  P.OX: 99% (Room air) BP: 108/72(Sitting, Left Arm, Standard)        Physical Exam Minerva Areola M. Federico Maiorino MD; 08/31/2020 3:01 PM)  General Mental Status-Alert. General Appearance-Consistent with stated age. Hydration-Well hydrated. Voice-Normal.  Head and Neck Head-normocephalic, atraumatic with no lesions or palpable masses. Trachea-midline. Thyroid Gland Characteristics - normal size and consistency.  Eye Eyeball - Bilateral-Extraocular movements intact. Sclera/Conjunctiva - Bilateral-No scleral icterus.  Chest and Lung Exam Chest and lung exam reveals -quiet, even and easy respiratory effort with no use of accessory  muscles and on auscultation, normal breath sounds, no adventitious sounds and normal vocal resonance. Inspection Chest Wall - Normal. Back - normal.  Breast - Did not examine.  Cardiovascular Cardiovascular examination reveals -normal heart sounds, regular rate and rhythm  with no murmurs and normal pedal pulses bilaterally.  Abdomen Inspection Inspection of the abdomen reveals - No Hernias. Skin - Scar - no surgical scars. Palpation/Percussion Palpation and Percussion of the abdomen reveal - Soft, Non Tender, No Rebound tenderness, No Rigidity (guarding) and No hepatosplenomegaly. Auscultation Auscultation of the abdomen reveals - Bowel sounds normal.  Male Genitourinary Note: Patient examined supine and standing. Massive left inguinal scrotal hernia. Not able to be reduced. Nontender. Testicle feels normal on the left. No evidence of right inguinal hernia. No bulge with Valsalva.  Peripheral Vascular Upper Extremity Palpation - Pulses bilaterally normal.  Neurologic Neurologic evaluation reveals -alert and oriented x 3 with no impairment of recent or remote memory. Mental Status-Normal.  Neuropsychiatric The patient's mood and affect are described as -normal. Judgment and Insight-insight is appropriate concerning matters relevant to self.  Musculoskeletal Normal Exam - Left-Upper Extremity Strength Normal and Lower Extremity Strength Normal. Normal Exam - Right-Upper Extremity Strength Normal and Lower Extremity Strength Normal.  Lymphatic Head & Neck  General Head & Neck Lymphatics: Bilateral - Description - Normal. Axillary - Did not examine. Femoral & Inguinal - Did not examine.    Assessment & Plan Minerva Areola M. 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 We discussed the signs & symptoms of incarceration & strangulation. We discussed non-operative and operative  management.  The patient has elected to proceed with surgery. Open repair of LIH   I described the procedure in detail. The patient was given educational material. We discussed the risks and benefits including but not limited to bleeding, infection, chronic inguinal pain, nerve entrapment, hernia recurrence, mesh complications, hematoma formation, urinary retention, injury to the testicle, numbness in the groin, blood clots, injury to the surrounding structures, and anesthesia risk. We also discussed the typical post operative recovery course, including no heavy lifting for 4-6 weeks. I explained that the likelihood of improvement of their symptoms is good.   I did explain that he is at higher risk for injury to the testicle as well as testicular loss, chronic nerve pain, seroma/hematoma formation, recurrence given the large size it is inguinal scrotal hernia. We did discuss the impact of tobacco use perioperatively. All his questions were asked and answered.  This patient encounter took 34 minutes today to perform the following: take history, perform exam, review outside records, interpret imaging, counsel the patient on their diagnosis and document encounter, findings & plan in the EHR  Current Plans Pt Education - Pamphlet Given - Hernia Surgery: discussed with patient and provided information. You are being scheduled for surgery- Our schedulers will call youonce we get approval from your worker's compensation agency  You should hear from our office's scheduling department within 7 working days about the location, date, and time of surgery. We try to make accommodations for patient's preferences in scheduling surgery, but sometimes the OR schedule or the surgeon's schedule prevents [place] from making those accommodations.  If you have not heard from our office [phone]) in 7 working days, call the office and ask for your surgeon's nurse.  If you have other questions about your diagnosis,  plan, or surgery, call the office and ask for your surgeon's nurse.  Mary Sella. Andrey Campanile, MD, FACS General, Bariatric, & Minimally Invasive Surgery [facility], [place]

## 2020-08-31 NOTE — ED Triage Notes (Signed)
Patient presented to front desk stating he is here for his scheduled surgery.  Christiane Ha, patient access, notified this RN to review and come speak to patient, as patient was confused when told we don't schedule or perform surgeries.  This RN reviewed patient's chart, read provider note from visit on 10/21, and spoke to patient.  Patient states he doesn't understand what is taking so long for him to have "emergency surgery".  This RN explained that, Per Dr. Lindaann Slough note, he was to follow up with [facility].  Patient states he did that and he was told he was having surgery today.  This RN explained that he would not come here for surgery.  That he needs to follow up with [facility] on where they scheduled him, or IF they scheduled him.  Also reviewed ER precautions, per Dr. Lindaann Slough note, again with patient.  Patient grabbed his items and left.
